# Patient Record
Sex: Female | Born: 1935
Health system: Southern US, Community
[De-identification: ages and names within clinical notes are randomized; demographics above are authoritative.]

## PROBLEM LIST (undated history)

## (undated) DIAGNOSIS — M1990 Unspecified osteoarthritis, unspecified site: Secondary | ICD-10-CM

## (undated) DIAGNOSIS — I10 Essential (primary) hypertension: Secondary | ICD-10-CM

## (undated) DIAGNOSIS — F32A Depression, unspecified: Secondary | ICD-10-CM

## (undated) DIAGNOSIS — H9190 Unspecified hearing loss, unspecified ear: Secondary | ICD-10-CM

---

## 2000-06-16 DIAGNOSIS — Z955 Presence of coronary angioplasty implant and graft: Secondary | ICD-10-CM

## 2000-06-16 HISTORY — DX: Presence of coronary angioplasty implant and graft: Z95.5

## 2000-11-18 ENCOUNTER — Ambulatory Visit (HOSPITAL_COMMUNITY): Admission: RE | Admit: 2000-11-18 | Discharge: 2000-11-19 | Payer: Self-pay | Admitting: *Deleted

## 2004-04-05 ENCOUNTER — Ambulatory Visit: Payer: Self-pay | Admitting: Internal Medicine

## 2004-05-03 ENCOUNTER — Ambulatory Visit: Payer: Self-pay | Admitting: Internal Medicine

## 2004-12-11 ENCOUNTER — Ambulatory Visit: Payer: Self-pay | Admitting: Internal Medicine

## 2005-07-22 ENCOUNTER — Ambulatory Visit: Payer: Self-pay | Admitting: Internal Medicine

## 2006-03-31 ENCOUNTER — Emergency Department: Payer: Self-pay | Admitting: Internal Medicine

## 2006-04-01 ENCOUNTER — Ambulatory Visit: Payer: Self-pay | Admitting: Emergency Medicine

## 2006-06-19 ENCOUNTER — Ambulatory Visit: Payer: Self-pay | Admitting: Internal Medicine

## 2006-08-26 ENCOUNTER — Ambulatory Visit: Payer: Self-pay | Admitting: Internal Medicine

## 2007-09-02 ENCOUNTER — Ambulatory Visit: Payer: Self-pay | Admitting: Internal Medicine

## 2008-07-06 ENCOUNTER — Ambulatory Visit: Payer: Self-pay | Admitting: Internal Medicine

## 2008-07-27 ENCOUNTER — Ambulatory Visit: Payer: Self-pay | Admitting: Podiatry

## 2008-08-04 ENCOUNTER — Ambulatory Visit: Payer: Self-pay | Admitting: Podiatry

## 2008-10-31 ENCOUNTER — Ambulatory Visit: Payer: Self-pay | Admitting: Internal Medicine

## 2009-01-01 ENCOUNTER — Ambulatory Visit: Payer: Self-pay | Admitting: Podiatry

## 2009-08-04 ENCOUNTER — Emergency Department: Payer: Self-pay | Admitting: Unknown Physician Specialty

## 2009-09-25 ENCOUNTER — Emergency Department: Payer: Self-pay | Admitting: Emergency Medicine

## 2009-11-13 ENCOUNTER — Ambulatory Visit: Payer: Self-pay | Admitting: Internal Medicine

## 2010-05-21 ENCOUNTER — Ambulatory Visit: Payer: Self-pay | Admitting: Internal Medicine

## 2010-11-18 ENCOUNTER — Ambulatory Visit: Payer: Self-pay | Admitting: Internal Medicine

## 2011-01-22 ENCOUNTER — Ambulatory Visit: Payer: Self-pay | Admitting: Family

## 2011-05-27 IMAGING — CT CT HEAD WITHOUT CONTRAST
2 series · 15 of 30 positions shown, 19 images · non-contrast
Comparison: none

REASON FOR EXAM: head injury
COMMENTS:

[Series 2: without · axial · non-contrast · 0.41mm/px · z∈[-180,-56]mm · 13 of 31 slices shown, 17 images]
[im 3/31  brain]
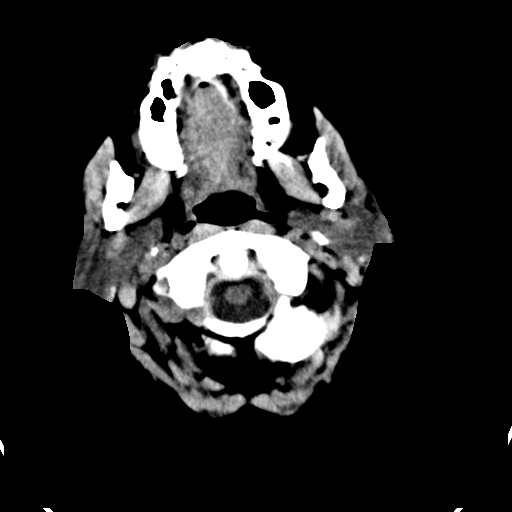
[im 3/31  bone]
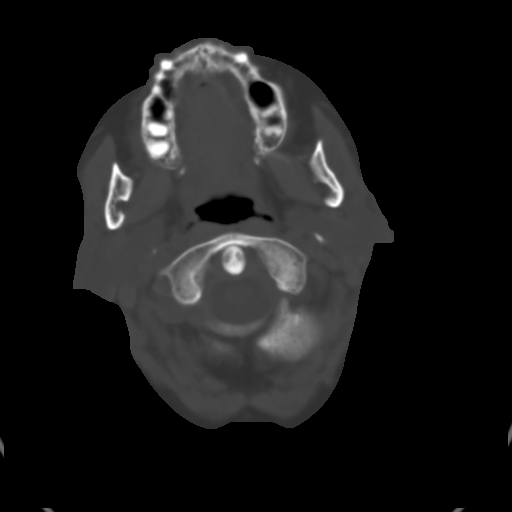
[im 5/31  brain]
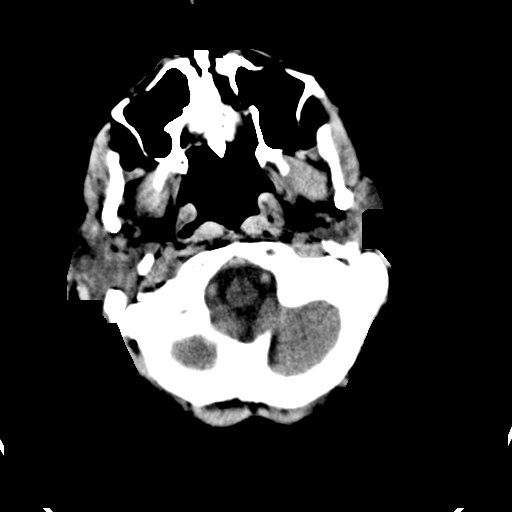
[im 7/31  brain]
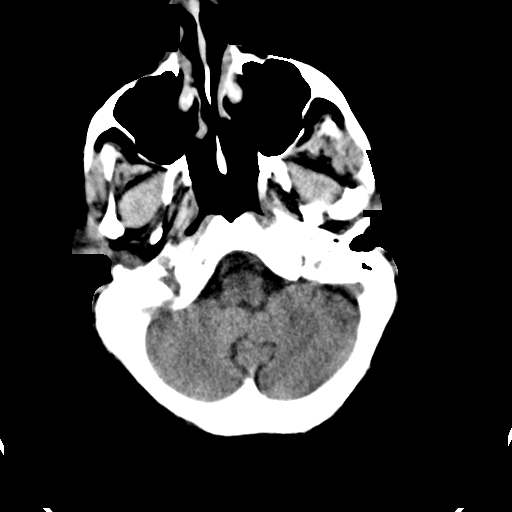
[im 9/31  brain]
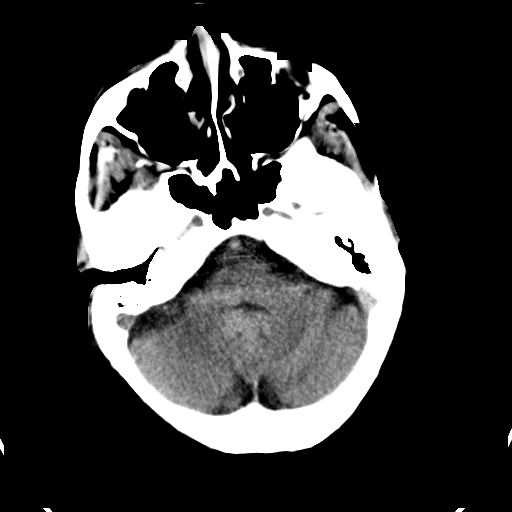
[im 11/31  brain]
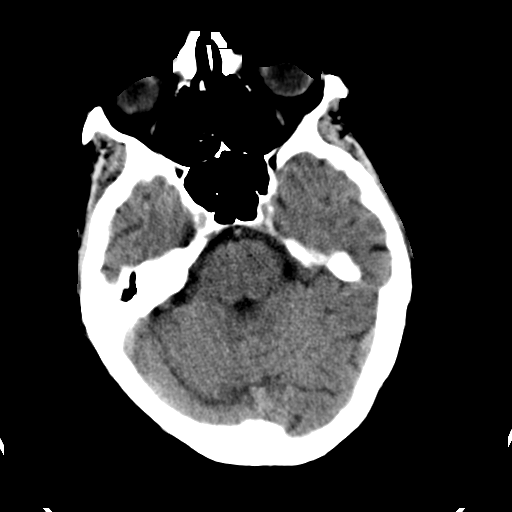
[im 11/31  bone]
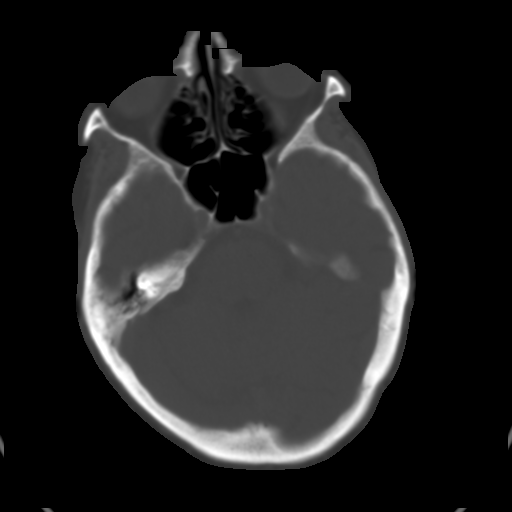
[im 13/31  brain]
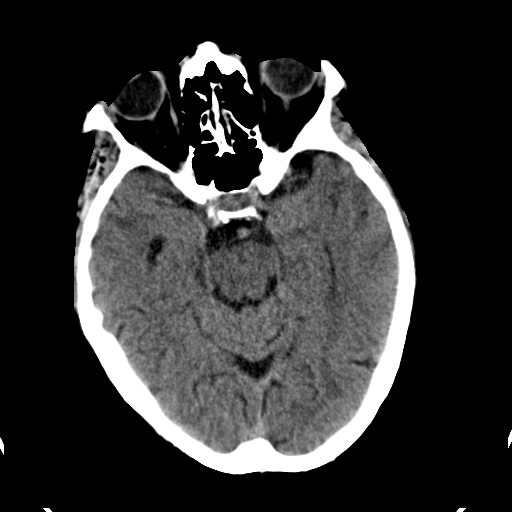
[im 16/31  brain]
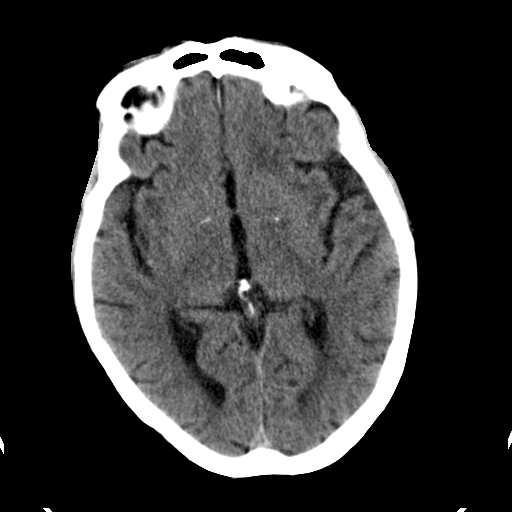
[im 18/31  brain]
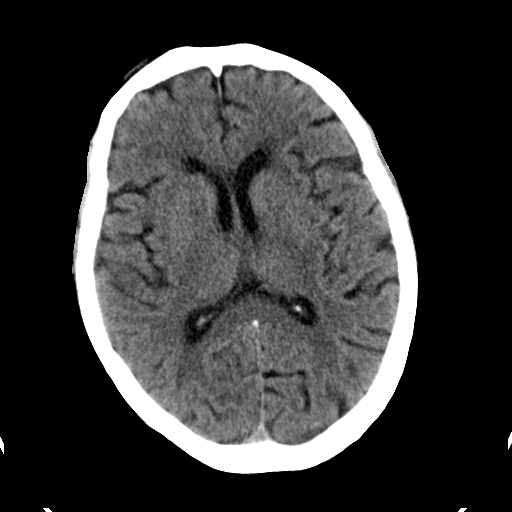
[im 20/31  brain]
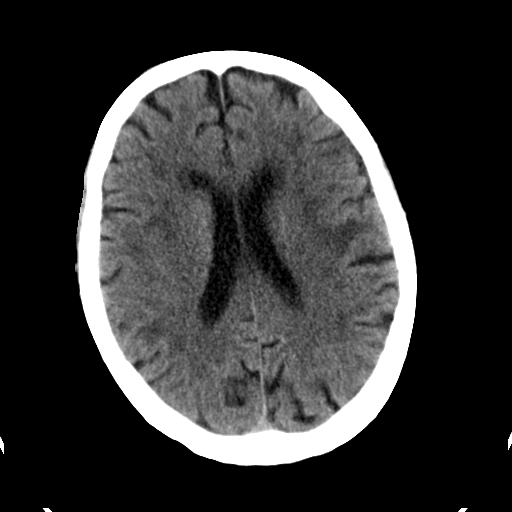
[im 20/31  bone]
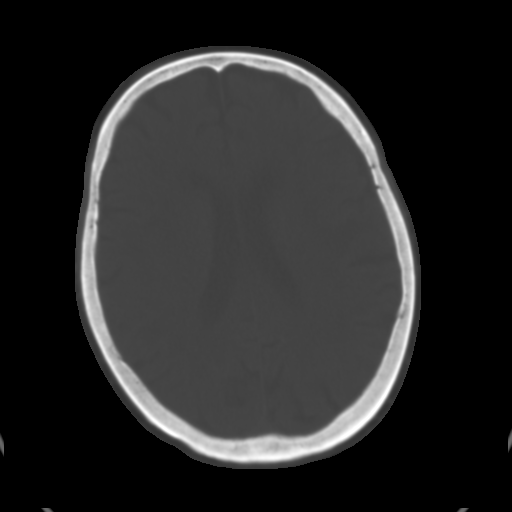
[im 22/31  brain]
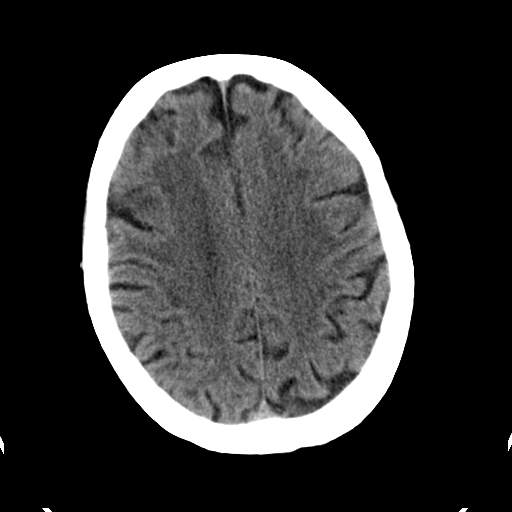
[im 24/31  brain]
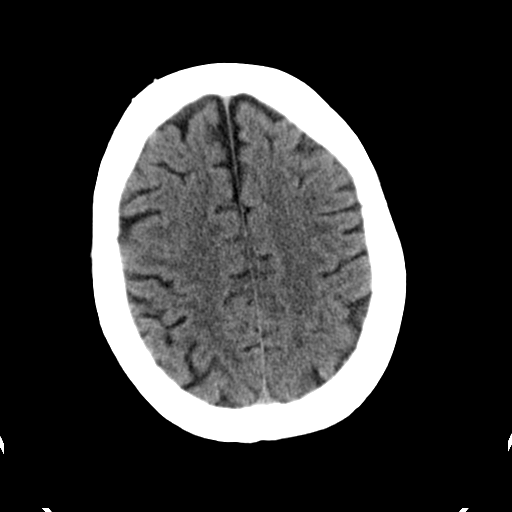
[im 26/31  brain]
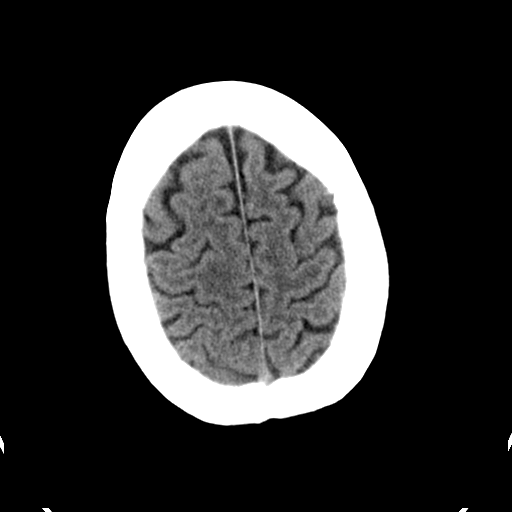
[im 28/31  brain]
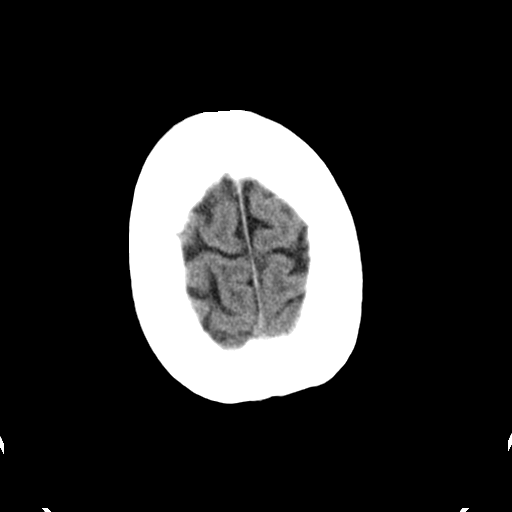
[im 28/31  bone]
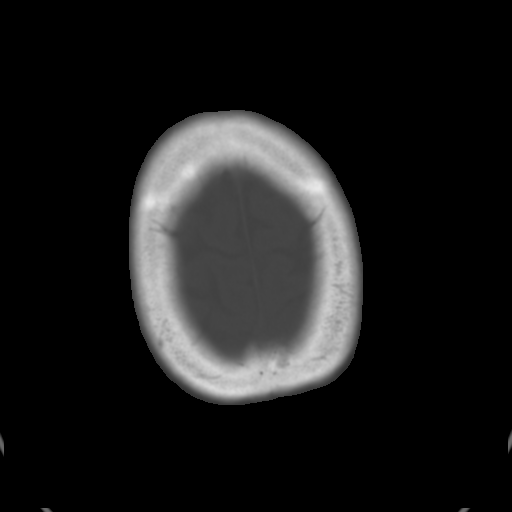

[Series 3: bone · axial · 0.41mm/px · z∈[-180,-160]mm · 2 of 31 slices shown]
[im 3/31  bone]
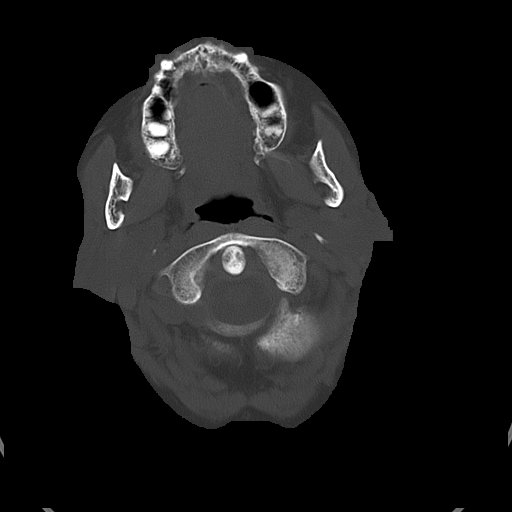
[im 7/31  bone]
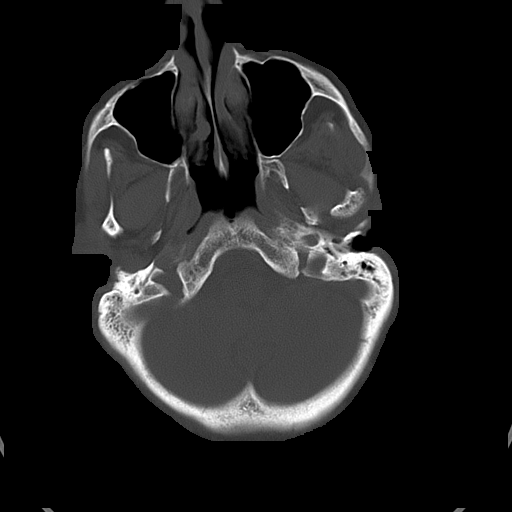

[15 of 30 positions shown; findings below may reference images not displayed]

PROCEDURE:     CT  - CT HEAD WITHOUT CONTRAST  - August 04, 2009 [DATE]

RESULT:     Axial noncontrast CT scanning was performed through the brain at
5 mm intervals and slice thicknesses. There are no prior studies for
comparison.

There are mild age-appropriate atrophic changes present. The ventricles are
normal in size and position. There is decreased density in the deep white
matter of both cerebral hemispheres consistent with chronic small vessel
ischemic type change. There is basal ganglia calcification bilaterally. I do
not see evidence of an acute intracranial hemorrhage nor of an acute
evolving ischemic infarction. At bone window settings the observed portions
of the paranasal sinuses are clear. There is disruption of the soft tissues
over the right forehead. I do not see evidence of an underlying skull
fracture.
IMPRESSION: I see no acute intracranial abnormality. There are chronic
age-appropriate changes present. There is disruption of the soft tissues
overlying the right forehead without evidence of an underlying fracture.

## 2011-11-25 ENCOUNTER — Ambulatory Visit: Payer: Self-pay | Admitting: Internal Medicine

## 2012-11-30 ENCOUNTER — Ambulatory Visit: Payer: Self-pay | Admitting: Internal Medicine

## 2014-01-06 ENCOUNTER — Ambulatory Visit: Payer: Self-pay | Admitting: Internal Medicine

## 2014-04-13 ENCOUNTER — Encounter: Payer: Self-pay | Admitting: Surgery

## 2014-04-16 ENCOUNTER — Encounter: Payer: Self-pay | Admitting: Surgery

## 2014-04-24 ENCOUNTER — Encounter: Payer: Self-pay | Admitting: Surgery

## 2014-07-20 DIAGNOSIS — L03116 Cellulitis of left lower limb: Secondary | ICD-10-CM | POA: Diagnosis not present

## 2014-07-28 DIAGNOSIS — L03116 Cellulitis of left lower limb: Secondary | ICD-10-CM | POA: Diagnosis not present

## 2014-07-28 DIAGNOSIS — E784 Other hyperlipidemia: Secondary | ICD-10-CM | POA: Diagnosis not present

## 2014-07-28 DIAGNOSIS — K5732 Diverticulitis of large intestine without perforation or abscess without bleeding: Secondary | ICD-10-CM | POA: Diagnosis not present

## 2014-07-28 DIAGNOSIS — I10 Essential (primary) hypertension: Secondary | ICD-10-CM | POA: Diagnosis not present

## 2014-08-18 DIAGNOSIS — H6120 Impacted cerumen, unspecified ear: Secondary | ICD-10-CM | POA: Diagnosis not present

## 2014-08-18 DIAGNOSIS — H918X9 Other specified hearing loss, unspecified ear: Secondary | ICD-10-CM | POA: Diagnosis not present

## 2014-08-18 DIAGNOSIS — I10 Essential (primary) hypertension: Secondary | ICD-10-CM | POA: Diagnosis not present

## 2014-08-18 DIAGNOSIS — S81812A Laceration without foreign body, left lower leg, initial encounter: Secondary | ICD-10-CM | POA: Diagnosis not present

## 2014-08-24 DIAGNOSIS — H938X9 Other specified disorders of ear, unspecified ear: Secondary | ICD-10-CM | POA: Diagnosis not present

## 2014-08-24 DIAGNOSIS — H6120 Impacted cerumen, unspecified ear: Secondary | ICD-10-CM | POA: Diagnosis not present

## 2014-10-13 DIAGNOSIS — H3532 Exudative age-related macular degeneration: Secondary | ICD-10-CM | POA: Diagnosis not present

## 2014-10-16 DIAGNOSIS — H3532 Exudative age-related macular degeneration: Secondary | ICD-10-CM | POA: Diagnosis not present

## 2014-11-24 DIAGNOSIS — I10 Essential (primary) hypertension: Secondary | ICD-10-CM | POA: Diagnosis not present

## 2014-11-30 DIAGNOSIS — H3532 Exudative age-related macular degeneration: Secondary | ICD-10-CM | POA: Diagnosis not present

## 2015-01-04 DIAGNOSIS — H3532 Exudative age-related macular degeneration: Secondary | ICD-10-CM | POA: Diagnosis not present

## 2015-02-12 DIAGNOSIS — H3531 Nonexudative age-related macular degeneration: Secondary | ICD-10-CM | POA: Diagnosis not present

## 2015-02-23 DIAGNOSIS — I10 Essential (primary) hypertension: Secondary | ICD-10-CM | POA: Diagnosis not present

## 2015-02-23 DIAGNOSIS — K5732 Diverticulitis of large intestine without perforation or abscess without bleeding: Secondary | ICD-10-CM | POA: Diagnosis not present

## 2015-02-23 DIAGNOSIS — E784 Other hyperlipidemia: Secondary | ICD-10-CM | POA: Diagnosis not present

## 2015-02-28 DIAGNOSIS — Z23 Encounter for immunization: Secondary | ICD-10-CM | POA: Diagnosis not present

## 2015-03-07 DIAGNOSIS — H3532 Exudative age-related macular degeneration: Secondary | ICD-10-CM | POA: Diagnosis not present

## 2015-04-18 DIAGNOSIS — H353211 Exudative age-related macular degeneration, right eye, with active choroidal neovascularization: Secondary | ICD-10-CM | POA: Diagnosis not present

## 2015-05-21 DIAGNOSIS — I10 Essential (primary) hypertension: Secondary | ICD-10-CM | POA: Diagnosis not present

## 2015-05-22 DIAGNOSIS — R5381 Other malaise: Secondary | ICD-10-CM | POA: Diagnosis not present

## 2015-05-22 DIAGNOSIS — I10 Essential (primary) hypertension: Secondary | ICD-10-CM | POA: Diagnosis not present

## 2015-05-22 DIAGNOSIS — E784 Other hyperlipidemia: Secondary | ICD-10-CM | POA: Diagnosis not present

## 2015-05-25 DIAGNOSIS — I10 Essential (primary) hypertension: Secondary | ICD-10-CM | POA: Diagnosis not present

## 2015-05-25 DIAGNOSIS — Z23 Encounter for immunization: Secondary | ICD-10-CM | POA: Diagnosis not present

## 2015-06-01 DIAGNOSIS — H353211 Exudative age-related macular degeneration, right eye, with active choroidal neovascularization: Secondary | ICD-10-CM | POA: Diagnosis not present

## 2015-07-11 DIAGNOSIS — H353211 Exudative age-related macular degeneration, right eye, with active choroidal neovascularization: Secondary | ICD-10-CM | POA: Diagnosis not present

## 2015-08-23 DIAGNOSIS — S81812A Laceration without foreign body, left lower leg, initial encounter: Secondary | ICD-10-CM | POA: Diagnosis not present

## 2015-08-23 DIAGNOSIS — K5732 Diverticulitis of large intestine without perforation or abscess without bleeding: Secondary | ICD-10-CM | POA: Diagnosis not present

## 2015-08-23 DIAGNOSIS — I10 Essential (primary) hypertension: Secondary | ICD-10-CM | POA: Diagnosis not present

## 2015-08-29 DIAGNOSIS — H353211 Exudative age-related macular degeneration, right eye, with active choroidal neovascularization: Secondary | ICD-10-CM | POA: Diagnosis not present

## 2015-10-24 DIAGNOSIS — H353211 Exudative age-related macular degeneration, right eye, with active choroidal neovascularization: Secondary | ICD-10-CM | POA: Diagnosis not present

## 2015-11-23 DIAGNOSIS — E784 Other hyperlipidemia: Secondary | ICD-10-CM | POA: Diagnosis not present

## 2015-11-23 DIAGNOSIS — I10 Essential (primary) hypertension: Secondary | ICD-10-CM | POA: Diagnosis not present

## 2015-12-04 DIAGNOSIS — D485 Neoplasm of uncertain behavior of skin: Secondary | ICD-10-CM | POA: Diagnosis not present

## 2015-12-04 DIAGNOSIS — C44311 Basal cell carcinoma of skin of nose: Secondary | ICD-10-CM | POA: Diagnosis not present

## 2015-12-21 DIAGNOSIS — H353211 Exudative age-related macular degeneration, right eye, with active choroidal neovascularization: Secondary | ICD-10-CM | POA: Diagnosis not present

## 2016-02-01 DIAGNOSIS — H353211 Exudative age-related macular degeneration, right eye, with active choroidal neovascularization: Secondary | ICD-10-CM | POA: Diagnosis not present

## 2016-02-22 DIAGNOSIS — C4491 Basal cell carcinoma of skin, unspecified: Secondary | ICD-10-CM | POA: Diagnosis not present

## 2016-02-22 DIAGNOSIS — Z23 Encounter for immunization: Secondary | ICD-10-CM | POA: Diagnosis not present

## 2016-02-22 DIAGNOSIS — K5732 Diverticulitis of large intestine without perforation or abscess without bleeding: Secondary | ICD-10-CM | POA: Diagnosis not present

## 2016-02-22 DIAGNOSIS — E784 Other hyperlipidemia: Secondary | ICD-10-CM | POA: Diagnosis not present

## 2016-03-04 DIAGNOSIS — C44311 Basal cell carcinoma of skin of nose: Secondary | ICD-10-CM | POA: Diagnosis not present

## 2016-03-14 DIAGNOSIS — H353211 Exudative age-related macular degeneration, right eye, with active choroidal neovascularization: Secondary | ICD-10-CM | POA: Diagnosis not present

## 2016-04-25 DIAGNOSIS — H353211 Exudative age-related macular degeneration, right eye, with active choroidal neovascularization: Secondary | ICD-10-CM | POA: Diagnosis not present

## 2016-05-29 DIAGNOSIS — E784 Other hyperlipidemia: Secondary | ICD-10-CM | POA: Diagnosis not present

## 2016-05-29 DIAGNOSIS — I10 Essential (primary) hypertension: Secondary | ICD-10-CM | POA: Diagnosis not present

## 2016-05-29 DIAGNOSIS — K5732 Diverticulitis of large intestine without perforation or abscess without bleeding: Secondary | ICD-10-CM | POA: Diagnosis not present

## 2016-06-06 DIAGNOSIS — H353211 Exudative age-related macular degeneration, right eye, with active choroidal neovascularization: Secondary | ICD-10-CM | POA: Diagnosis not present

## 2016-07-07 DIAGNOSIS — C4491 Basal cell carcinoma of skin, unspecified: Secondary | ICD-10-CM | POA: Diagnosis not present

## 2016-07-07 DIAGNOSIS — E784 Other hyperlipidemia: Secondary | ICD-10-CM | POA: Diagnosis not present

## 2016-07-07 DIAGNOSIS — K5732 Diverticulitis of large intestine without perforation or abscess without bleeding: Secondary | ICD-10-CM | POA: Diagnosis not present

## 2016-07-11 DIAGNOSIS — H353211 Exudative age-related macular degeneration, right eye, with active choroidal neovascularization: Secondary | ICD-10-CM | POA: Diagnosis not present

## 2016-08-22 DIAGNOSIS — H353211 Exudative age-related macular degeneration, right eye, with active choroidal neovascularization: Secondary | ICD-10-CM | POA: Diagnosis not present

## 2016-08-28 DIAGNOSIS — E784 Other hyperlipidemia: Secondary | ICD-10-CM | POA: Diagnosis not present

## 2016-08-28 DIAGNOSIS — K5732 Diverticulitis of large intestine without perforation or abscess without bleeding: Secondary | ICD-10-CM | POA: Diagnosis not present

## 2016-08-28 DIAGNOSIS — I10 Essential (primary) hypertension: Secondary | ICD-10-CM | POA: Diagnosis not present

## 2016-09-11 ENCOUNTER — Ambulatory Visit
Admission: RE | Admit: 2016-09-11 | Discharge: 2016-09-11 | Disposition: A | Discharge status: Home / Self Care | Payer: Medicare HMO | Source: Ambulatory Visit | Attending: Internal Medicine | Admitting: Internal Medicine

## 2016-09-11 ENCOUNTER — Other Ambulatory Visit: Payer: Self-pay | Admitting: Internal Medicine

## 2016-09-11 DIAGNOSIS — M1711 Unilateral primary osteoarthritis, right knee: Secondary | ICD-10-CM | POA: Diagnosis not present

## 2016-09-11 DIAGNOSIS — I10 Essential (primary) hypertension: Secondary | ICD-10-CM | POA: Diagnosis not present

## 2016-09-11 DIAGNOSIS — M02369 Reiter's disease, unspecified knee: Secondary | ICD-10-CM | POA: Diagnosis not present

## 2016-09-11 DIAGNOSIS — M25561 Pain in right knee: Secondary | ICD-10-CM | POA: Diagnosis not present

## 2016-09-11 DIAGNOSIS — Z880 Allergy status to penicillin: Secondary | ICD-10-CM | POA: Diagnosis not present

## 2016-09-11 DIAGNOSIS — E784 Other hyperlipidemia: Secondary | ICD-10-CM | POA: Diagnosis not present

## 2016-09-16 DIAGNOSIS — Z880 Allergy status to penicillin: Secondary | ICD-10-CM | POA: Diagnosis not present

## 2016-09-16 DIAGNOSIS — E784 Other hyperlipidemia: Secondary | ICD-10-CM | POA: Diagnosis not present

## 2016-09-16 DIAGNOSIS — M02369 Reiter's disease, unspecified knee: Secondary | ICD-10-CM | POA: Diagnosis not present

## 2016-09-16 DIAGNOSIS — I10 Essential (primary) hypertension: Secondary | ICD-10-CM | POA: Diagnosis not present

## 2016-10-03 DIAGNOSIS — H353211 Exudative age-related macular degeneration, right eye, with active choroidal neovascularization: Secondary | ICD-10-CM | POA: Diagnosis not present

## 2016-11-21 DIAGNOSIS — H353211 Exudative age-related macular degeneration, right eye, with active choroidal neovascularization: Secondary | ICD-10-CM | POA: Diagnosis not present

## 2016-11-27 DIAGNOSIS — K5732 Diverticulitis of large intestine without perforation or abscess without bleeding: Secondary | ICD-10-CM | POA: Diagnosis not present

## 2016-11-27 DIAGNOSIS — E784 Other hyperlipidemia: Secondary | ICD-10-CM | POA: Diagnosis not present

## 2016-11-27 DIAGNOSIS — I10 Essential (primary) hypertension: Secondary | ICD-10-CM | POA: Diagnosis not present

## 2016-11-27 DIAGNOSIS — M2392 Unspecified internal derangement of left knee: Secondary | ICD-10-CM | POA: Diagnosis not present

## 2017-01-06 DIAGNOSIS — H353211 Exudative age-related macular degeneration, right eye, with active choroidal neovascularization: Secondary | ICD-10-CM | POA: Diagnosis not present

## 2017-02-25 DIAGNOSIS — H353211 Exudative age-related macular degeneration, right eye, with active choroidal neovascularization: Secondary | ICD-10-CM | POA: Diagnosis not present

## 2017-02-27 DIAGNOSIS — K5732 Diverticulitis of large intestine without perforation or abscess without bleeding: Secondary | ICD-10-CM | POA: Diagnosis not present

## 2017-02-27 DIAGNOSIS — I10 Essential (primary) hypertension: Secondary | ICD-10-CM | POA: Diagnosis not present

## 2017-02-27 DIAGNOSIS — M02369 Reiter's disease, unspecified knee: Secondary | ICD-10-CM | POA: Diagnosis not present

## 2017-02-27 DIAGNOSIS — E784 Other hyperlipidemia: Secondary | ICD-10-CM | POA: Diagnosis not present

## 2017-04-08 DIAGNOSIS — H2513 Age-related nuclear cataract, bilateral: Secondary | ICD-10-CM | POA: Diagnosis not present

## 2017-04-08 DIAGNOSIS — H353211 Exudative age-related macular degeneration, right eye, with active choroidal neovascularization: Secondary | ICD-10-CM | POA: Diagnosis not present

## 2017-05-20 DIAGNOSIS — H353211 Exudative age-related macular degeneration, right eye, with active choroidal neovascularization: Secondary | ICD-10-CM | POA: Diagnosis not present

## 2017-05-29 DIAGNOSIS — Z0189 Encounter for other specified special examinations: Secondary | ICD-10-CM | POA: Diagnosis not present

## 2017-07-01 DIAGNOSIS — H353211 Exudative age-related macular degeneration, right eye, with active choroidal neovascularization: Secondary | ICD-10-CM | POA: Diagnosis not present

## 2017-08-12 DIAGNOSIS — H353211 Exudative age-related macular degeneration, right eye, with active choroidal neovascularization: Secondary | ICD-10-CM | POA: Diagnosis not present

## 2017-08-12 DIAGNOSIS — H2513 Age-related nuclear cataract, bilateral: Secondary | ICD-10-CM | POA: Diagnosis not present

## 2017-09-01 DIAGNOSIS — K5732 Diverticulitis of large intestine without perforation or abscess without bleeding: Secondary | ICD-10-CM | POA: Diagnosis not present

## 2017-09-01 DIAGNOSIS — I10 Essential (primary) hypertension: Secondary | ICD-10-CM | POA: Diagnosis not present

## 2017-09-01 DIAGNOSIS — M02369 Reiter's disease, unspecified knee: Secondary | ICD-10-CM | POA: Diagnosis not present

## 2017-09-01 DIAGNOSIS — E785 Hyperlipidemia, unspecified: Secondary | ICD-10-CM | POA: Diagnosis not present

## 2017-09-25 DIAGNOSIS — Z85828 Personal history of other malignant neoplasm of skin: Secondary | ICD-10-CM | POA: Diagnosis not present

## 2017-09-25 DIAGNOSIS — Z85820 Personal history of malignant melanoma of skin: Secondary | ICD-10-CM | POA: Diagnosis not present

## 2017-09-25 DIAGNOSIS — L570 Actinic keratosis: Secondary | ICD-10-CM | POA: Diagnosis not present

## 2017-09-25 DIAGNOSIS — L821 Other seborrheic keratosis: Secondary | ICD-10-CM | POA: Diagnosis not present

## 2017-09-29 DIAGNOSIS — I10 Essential (primary) hypertension: Secondary | ICD-10-CM | POA: Diagnosis not present

## 2017-09-29 DIAGNOSIS — K5732 Diverticulitis of large intestine without perforation or abscess without bleeding: Secondary | ICD-10-CM | POA: Diagnosis not present

## 2017-09-29 DIAGNOSIS — M02369 Reiter's disease, unspecified knee: Secondary | ICD-10-CM | POA: Diagnosis not present

## 2017-09-29 DIAGNOSIS — E785 Hyperlipidemia, unspecified: Secondary | ICD-10-CM | POA: Diagnosis not present

## 2017-09-30 DIAGNOSIS — H353211 Exudative age-related macular degeneration, right eye, with active choroidal neovascularization: Secondary | ICD-10-CM | POA: Diagnosis not present

## 2017-11-18 DIAGNOSIS — H353211 Exudative age-related macular degeneration, right eye, with active choroidal neovascularization: Secondary | ICD-10-CM | POA: Diagnosis not present

## 2017-12-01 DIAGNOSIS — K5732 Diverticulitis of large intestine without perforation or abscess without bleeding: Secondary | ICD-10-CM | POA: Diagnosis not present

## 2017-12-01 DIAGNOSIS — I10 Essential (primary) hypertension: Secondary | ICD-10-CM | POA: Diagnosis not present

## 2017-12-01 DIAGNOSIS — M02369 Reiter's disease, unspecified knee: Secondary | ICD-10-CM | POA: Diagnosis not present

## 2017-12-01 DIAGNOSIS — E785 Hyperlipidemia, unspecified: Secondary | ICD-10-CM | POA: Diagnosis not present

## 2017-12-23 DIAGNOSIS — H2513 Age-related nuclear cataract, bilateral: Secondary | ICD-10-CM | POA: Diagnosis not present

## 2017-12-23 DIAGNOSIS — H353211 Exudative age-related macular degeneration, right eye, with active choroidal neovascularization: Secondary | ICD-10-CM | POA: Diagnosis not present

## 2018-02-11 DIAGNOSIS — H353211 Exudative age-related macular degeneration, right eye, with active choroidal neovascularization: Secondary | ICD-10-CM | POA: Diagnosis not present

## 2018-03-02 DIAGNOSIS — M02369 Reiter's disease, unspecified knee: Secondary | ICD-10-CM | POA: Diagnosis not present

## 2018-03-02 DIAGNOSIS — E785 Hyperlipidemia, unspecified: Secondary | ICD-10-CM | POA: Diagnosis not present

## 2018-03-02 DIAGNOSIS — I10 Essential (primary) hypertension: Secondary | ICD-10-CM | POA: Diagnosis not present

## 2018-03-02 DIAGNOSIS — K5732 Diverticulitis of large intestine without perforation or abscess without bleeding: Secondary | ICD-10-CM | POA: Diagnosis not present

## 2018-03-02 DIAGNOSIS — Z23 Encounter for immunization: Secondary | ICD-10-CM | POA: Diagnosis not present

## 2018-03-25 DIAGNOSIS — H353122 Nonexudative age-related macular degeneration, left eye, intermediate dry stage: Secondary | ICD-10-CM | POA: Diagnosis not present

## 2018-03-25 DIAGNOSIS — H353211 Exudative age-related macular degeneration, right eye, with active choroidal neovascularization: Secondary | ICD-10-CM | POA: Diagnosis not present

## 2018-05-06 DIAGNOSIS — H353211 Exudative age-related macular degeneration, right eye, with active choroidal neovascularization: Secondary | ICD-10-CM | POA: Diagnosis not present

## 2018-05-28 DIAGNOSIS — C44619 Basal cell carcinoma of skin of left upper limb, including shoulder: Secondary | ICD-10-CM | POA: Diagnosis not present

## 2018-05-28 DIAGNOSIS — C44712 Basal cell carcinoma of skin of right lower limb, including hip: Secondary | ICD-10-CM | POA: Diagnosis not present

## 2018-05-28 DIAGNOSIS — Z85828 Personal history of other malignant neoplasm of skin: Secondary | ICD-10-CM | POA: Diagnosis not present

## 2018-05-28 DIAGNOSIS — C44519 Basal cell carcinoma of skin of other part of trunk: Secondary | ICD-10-CM | POA: Diagnosis not present

## 2018-05-28 DIAGNOSIS — L570 Actinic keratosis: Secondary | ICD-10-CM | POA: Diagnosis not present

## 2018-05-28 DIAGNOSIS — L821 Other seborrheic keratosis: Secondary | ICD-10-CM | POA: Diagnosis not present

## 2018-05-28 DIAGNOSIS — Z85820 Personal history of malignant melanoma of skin: Secondary | ICD-10-CM | POA: Diagnosis not present

## 2018-06-01 DIAGNOSIS — Z0000 Encounter for general adult medical examination without abnormal findings: Secondary | ICD-10-CM | POA: Diagnosis not present

## 2018-06-01 DIAGNOSIS — E7849 Other hyperlipidemia: Secondary | ICD-10-CM | POA: Diagnosis not present

## 2018-06-01 DIAGNOSIS — R5381 Other malaise: Secondary | ICD-10-CM | POA: Diagnosis not present

## 2018-06-01 DIAGNOSIS — M02369 Reiter's disease, unspecified knee: Secondary | ICD-10-CM | POA: Diagnosis not present

## 2018-06-01 DIAGNOSIS — E7841 Elevated Lipoprotein(a): Secondary | ICD-10-CM | POA: Diagnosis not present

## 2018-06-01 DIAGNOSIS — K5732 Diverticulitis of large intestine without perforation or abscess without bleeding: Secondary | ICD-10-CM | POA: Diagnosis not present

## 2018-06-01 DIAGNOSIS — E785 Hyperlipidemia, unspecified: Secondary | ICD-10-CM | POA: Diagnosis not present

## 2018-06-01 DIAGNOSIS — I10 Essential (primary) hypertension: Secondary | ICD-10-CM | POA: Diagnosis not present

## 2018-06-22 DIAGNOSIS — H353211 Exudative age-related macular degeneration, right eye, with active choroidal neovascularization: Secondary | ICD-10-CM | POA: Diagnosis not present

## 2018-07-19 DIAGNOSIS — K5732 Diverticulitis of large intestine without perforation or abscess without bleeding: Secondary | ICD-10-CM | POA: Diagnosis not present

## 2018-07-19 DIAGNOSIS — E785 Hyperlipidemia, unspecified: Secondary | ICD-10-CM | POA: Diagnosis not present

## 2018-07-19 DIAGNOSIS — M02369 Reiter's disease, unspecified knee: Secondary | ICD-10-CM | POA: Diagnosis not present

## 2018-07-19 DIAGNOSIS — I10 Essential (primary) hypertension: Secondary | ICD-10-CM | POA: Diagnosis not present

## 2018-08-04 DIAGNOSIS — H353211 Exudative age-related macular degeneration, right eye, with active choroidal neovascularization: Secondary | ICD-10-CM | POA: Diagnosis not present

## 2018-08-31 DIAGNOSIS — I10 Essential (primary) hypertension: Secondary | ICD-10-CM | POA: Diagnosis not present

## 2018-08-31 DIAGNOSIS — M02369 Reiter's disease, unspecified knee: Secondary | ICD-10-CM | POA: Diagnosis not present

## 2018-08-31 DIAGNOSIS — E785 Hyperlipidemia, unspecified: Secondary | ICD-10-CM | POA: Diagnosis not present

## 2018-08-31 DIAGNOSIS — K5732 Diverticulitis of large intestine without perforation or abscess without bleeding: Secondary | ICD-10-CM | POA: Diagnosis not present

## 2018-09-09 DIAGNOSIS — H353211 Exudative age-related macular degeneration, right eye, with active choroidal neovascularization: Secondary | ICD-10-CM | POA: Diagnosis not present

## 2018-10-28 DIAGNOSIS — H353211 Exudative age-related macular degeneration, right eye, with active choroidal neovascularization: Secondary | ICD-10-CM | POA: Diagnosis not present

## 2018-12-07 DIAGNOSIS — E785 Hyperlipidemia, unspecified: Secondary | ICD-10-CM | POA: Diagnosis not present

## 2018-12-07 DIAGNOSIS — I10 Essential (primary) hypertension: Secondary | ICD-10-CM | POA: Diagnosis not present

## 2018-12-07 DIAGNOSIS — K5732 Diverticulitis of large intestine without perforation or abscess without bleeding: Secondary | ICD-10-CM | POA: Diagnosis not present

## 2018-12-07 DIAGNOSIS — M02369 Reiter's disease, unspecified knee: Secondary | ICD-10-CM | POA: Diagnosis not present

## 2018-12-09 DIAGNOSIS — H353211 Exudative age-related macular degeneration, right eye, with active choroidal neovascularization: Secondary | ICD-10-CM | POA: Diagnosis not present

## 2019-02-03 DIAGNOSIS — H2513 Age-related nuclear cataract, bilateral: Secondary | ICD-10-CM | POA: Diagnosis not present

## 2019-02-03 DIAGNOSIS — H353211 Exudative age-related macular degeneration, right eye, with active choroidal neovascularization: Secondary | ICD-10-CM | POA: Diagnosis not present

## 2019-02-18 DIAGNOSIS — L821 Other seborrheic keratosis: Secondary | ICD-10-CM | POA: Diagnosis not present

## 2019-02-18 DIAGNOSIS — I781 Nevus, non-neoplastic: Secondary | ICD-10-CM | POA: Diagnosis not present

## 2019-02-18 DIAGNOSIS — Z85828 Personal history of other malignant neoplasm of skin: Secondary | ICD-10-CM | POA: Diagnosis not present

## 2019-03-09 DIAGNOSIS — K5732 Diverticulitis of large intestine without perforation or abscess without bleeding: Secondary | ICD-10-CM | POA: Diagnosis not present

## 2019-03-09 DIAGNOSIS — Z23 Encounter for immunization: Secondary | ICD-10-CM | POA: Diagnosis not present

## 2019-03-09 DIAGNOSIS — I10 Essential (primary) hypertension: Secondary | ICD-10-CM | POA: Diagnosis not present

## 2019-03-09 DIAGNOSIS — M02369 Reiter's disease, unspecified knee: Secondary | ICD-10-CM | POA: Diagnosis not present

## 2019-03-09 DIAGNOSIS — E785 Hyperlipidemia, unspecified: Secondary | ICD-10-CM | POA: Diagnosis not present

## 2019-03-09 DIAGNOSIS — Z0000 Encounter for general adult medical examination without abnormal findings: Secondary | ICD-10-CM | POA: Diagnosis not present

## 2019-03-24 DIAGNOSIS — H353211 Exudative age-related macular degeneration, right eye, with active choroidal neovascularization: Secondary | ICD-10-CM | POA: Diagnosis not present

## 2019-05-19 DIAGNOSIS — H353211 Exudative age-related macular degeneration, right eye, with active choroidal neovascularization: Secondary | ICD-10-CM | POA: Diagnosis not present

## 2019-06-02 DIAGNOSIS — R5381 Other malaise: Secondary | ICD-10-CM | POA: Diagnosis not present

## 2019-06-02 DIAGNOSIS — I10 Essential (primary) hypertension: Secondary | ICD-10-CM | POA: Diagnosis not present

## 2019-06-02 DIAGNOSIS — E7849 Other hyperlipidemia: Secondary | ICD-10-CM | POA: Diagnosis not present

## 2019-06-02 DIAGNOSIS — E7841 Elevated Lipoprotein(a): Secondary | ICD-10-CM | POA: Diagnosis not present

## 2019-06-23 DIAGNOSIS — E785 Hyperlipidemia, unspecified: Secondary | ICD-10-CM | POA: Diagnosis not present

## 2019-06-23 DIAGNOSIS — M02369 Reiter's disease, unspecified knee: Secondary | ICD-10-CM | POA: Diagnosis not present

## 2019-06-23 DIAGNOSIS — I10 Essential (primary) hypertension: Secondary | ICD-10-CM | POA: Diagnosis not present

## 2019-06-23 DIAGNOSIS — K5732 Diverticulitis of large intestine without perforation or abscess without bleeding: Secondary | ICD-10-CM | POA: Diagnosis not present

## 2019-07-14 DIAGNOSIS — H353122 Nonexudative age-related macular degeneration, left eye, intermediate dry stage: Secondary | ICD-10-CM | POA: Diagnosis not present

## 2019-07-14 DIAGNOSIS — H353211 Exudative age-related macular degeneration, right eye, with active choroidal neovascularization: Secondary | ICD-10-CM | POA: Diagnosis not present

## 2019-08-17 DIAGNOSIS — H353231 Exudative age-related macular degeneration, bilateral, with active choroidal neovascularization: Secondary | ICD-10-CM | POA: Diagnosis not present

## 2019-09-22 DIAGNOSIS — M02369 Reiter's disease, unspecified knee: Secondary | ICD-10-CM | POA: Diagnosis not present

## 2019-09-22 DIAGNOSIS — K5732 Diverticulitis of large intestine without perforation or abscess without bleeding: Secondary | ICD-10-CM | POA: Diagnosis not present

## 2019-09-22 DIAGNOSIS — I10 Essential (primary) hypertension: Secondary | ICD-10-CM | POA: Diagnosis not present

## 2019-09-22 DIAGNOSIS — E785 Hyperlipidemia, unspecified: Secondary | ICD-10-CM | POA: Diagnosis not present

## 2019-09-29 DIAGNOSIS — H353231 Exudative age-related macular degeneration, bilateral, with active choroidal neovascularization: Secondary | ICD-10-CM | POA: Diagnosis not present

## 2019-11-17 DIAGNOSIS — H353231 Exudative age-related macular degeneration, bilateral, with active choroidal neovascularization: Secondary | ICD-10-CM | POA: Diagnosis not present

## 2019-12-22 ENCOUNTER — Ambulatory Visit (INDEPENDENT_AMBULATORY_CARE_PROVIDER_SITE_OTHER): Payer: Medicare HMO | Admitting: Internal Medicine

## 2019-12-22 ENCOUNTER — Encounter: Payer: Self-pay | Admitting: Internal Medicine

## 2019-12-22 ENCOUNTER — Other Ambulatory Visit: Payer: Self-pay

## 2019-12-22 VITALS — BP 146/76 | HR 60 | Ht 62.0 in | Wt 117.9 lb

## 2019-12-22 DIAGNOSIS — M810 Age-related osteoporosis without current pathological fracture: Secondary | ICD-10-CM | POA: Diagnosis not present

## 2019-12-22 DIAGNOSIS — E785 Hyperlipidemia, unspecified: Secondary | ICD-10-CM

## 2019-12-22 DIAGNOSIS — H8093 Unspecified otosclerosis, bilateral: Secondary | ICD-10-CM

## 2019-12-22 DIAGNOSIS — I10 Essential (primary) hypertension: Secondary | ICD-10-CM

## 2019-12-22 NOTE — Progress Notes (Signed)
Established Patient Office Visit  SUBJECTIVE:  Subjective  Patient ID: Katrina Bailey, female    DOB: 01/02/1924  Age: 86 y.o. MRN: [id number]  CC:  Chief Complaint  Patient presents with  . Hypertension    recheck blood pressure     HPI Katrina Bailey is a 86 y.o. female presenting today for a blood pressure recheck.  Her blood pressure today was 146/76.   She has several warts on her face that she is concerned about.   History reviewed. No pertinent past medical history.  History reviewed. No pertinent surgical history.  History reviewed. No pertinent family history.  Social History   Socioeconomic History  . Marital status: Widowed    Spouse name: Not on file  . Number of children: Not on file  . Years of education: Not on file  . Highest education level: Not on file  Occupational History  . Not on file  Tobacco Use  . Smoking status: Never Smoker  . Smokeless tobacco: Never Used  Substance and Sexual Activity  . Alcohol use: Never  . Drug use: Never  . Sexual activity: Not Currently  Other Topics Concern  . Not on file  Social History Narrative  . Not on file   Social Determinants of Health   Financial Resource Strain:   . Difficulty of Paying Living Expenses:   Food Insecurity:   . Worried About Charity fundraiser in the Last Year:   . Arboriculturist in the Last Year:   Transportation Needs:   . Film/video editor (Medical):   Marland Kitchen Lack of Transportation (Non-Medical):   Physical Activity:   . Days of Exercise per Week:   . Minutes of Exercise per Session:   Stress:   . Feeling of Stress :   Social Connections:   . Frequency of Communication with Friends and Family:   . Frequency of Social Gatherings with Friends and Family:   . Attends Religious Services:   . Active Member of Clubs or Organizations:   . Attends Archivist Meetings:   Marland Kitchen Marital Status:   Intimate Partner Violence:   . Fear of Current or Ex-Partner:   .  Emotionally Abused:   Marland Kitchen Physically Abused:   . Sexually Abused:      Current Outpatient Medications:  .  amLODipine (NORVASC) 5 MG tablet, Take 5 mg by mouth daily., Disp: , Rfl:  .  metoprolol tartrate (LOPRESSOR) 100 MG tablet, Take 100 mg by mouth daily., Disp: , Rfl:  .  Multiple Vitamin (MULTI-VITAMIN) tablet, Take 1 tablet by mouth daily., Disp: , Rfl:  .  quinapril (ACCUPRIL) 40 MG tablet, Take 40 mg by mouth daily., Disp: , Rfl:  .  simvastatin (ZOCOR) 40 MG tablet, Take 40 mg by mouth daily., Disp: , Rfl:    No Known Allergies  ROS Review of Systems  Constitutional: Negative.   HENT: Positive for hearing loss.   Eyes: Negative.   Respiratory: Negative.   Cardiovascular: Negative.   Gastrointestinal: Negative.   Endocrine: Negative.   Genitourinary: Negative.   Musculoskeletal: Negative.   Skin: Positive for rash.  Allergic/Immunologic: Negative.   Neurological: Negative.   Hematological: Negative.   Psychiatric/Behavioral: Negative.   All other systems reviewed and are negative.    OBJECTIVE:    Physical Exam Vitals reviewed.  Constitutional:      Appearance: Normal appearance.  HENT:     Right Ear: Decreased hearing noted.  Left Ear: Decreased hearing noted.     Mouth/Throat:     Mouth: Mucous membranes are moist.  Eyes:     Pupils: Pupils are equal, round, and reactive to light.  Cardiovascular:     Rate and Rhythm: Normal rate and regular rhythm.     Pulses: Normal pulses.     Heart sounds: Normal heart sounds.  Pulmonary:     Effort: Pulmonary effort is normal.     Breath sounds: Normal breath sounds.  Abdominal:     Palpations: There is no hepatomegaly, splenomegaly or mass.     Tenderness: There is no abdominal tenderness.  Musculoskeletal:     Right lower leg: No edema.     Left lower leg: 1+ Edema present.  Feet:     Right foot:     Toenail Condition: Fungal disease present.    Left foot:     Toenail Condition: Fungal disease  present.    Comments: Onychomycosis present Skin:    Comments: Several warts on face () Kyphosis on back  Neurological:     Mental Status: She is alert and oriented to person, place, and time.  Psychiatric:        Mood and Affect: Mood and affect normal.        Behavior: Behavior normal.     BP (!) 146/76   Pulse 60   Ht '5\' 2"'  (1.575 m)   Wt 117 lb 14.4 oz (53.5 kg)   BMI 21.56 kg/m  Wt Readings from Last 3 Encounters:  12/22/19 117 lb 14.4 oz (53.5 kg)    Health Maintenance Due  Topic Date Due  . COVID-19 Vaccine (1) Never done  . TETANUS/TDAP  Never done  . DEXA SCAN  Never done  . PNA vac Low Risk Adult (1 of 2 - PCV13) Never done    There are no preventive care reminders to display for this patient.  No flowsheet data found. No flowsheet data found.  No results found for: TSH No results found for: ALBUMIN, ANIONGAP, EGFR, GFR No results found for: CHOL, HDL, LDLCALC, CHOLHDL No results found for: TRIG No results found for: HGBA1C    ASSESSMENT & PLAN:   Problem List Items Addressed This Visit      Cardiovascular and Mediastinum   Essential hypertension - Primary    - Today, the patient's blood pressure is well managed on norvasc. - The patient will continue the current treatment regimen.  - I encouraged the patient to eat a low-sodium diet to help control blood pressure. - I encouraged the patient to live an active lifestyle and complete activities that increases heart rate to 85% target heart rate at least 5 times per week for one hour.          Relevant Medications   amLODipine (NORVASC) 5 MG tablet   metoprolol tartrate (LOPRESSOR) 100 MG tablet   quinapril (ACCUPRIL) 40 MG tablet   simvastatin (ZOCOR) 40 MG tablet     Nervous and Auditory   Otosclerosis of both ears    Ref to ent        Musculoskeletal and Integument   Age-related osteoporosis without current pathological fracture    Stable on vit d        Other   Dyslipidemia     On statin      Relevant Medications   simvastatin (ZOCOR) 40 MG tablet      No orders of the defined types were placed in this encounter.  Follow-up: No follow-ups on file.  Ref to skin doctor   Dr. Alexsa Canary [facility] [address] [place], [place] [address]   By signing my name below, I, [employer], attest that this documentation has been prepared under the direction and in the presence of Cletis Athens, MD. Electronically Signed: Cletis Athens, MD 12/24/19, 2:17 PM    I personally performed the services described in this documentation, which was SCRIBED in my presence. The recorded information has been reviewed and considered accurate. It has been edited as necessary during review. Cletis Athens, MD

## 2019-12-24 ENCOUNTER — Encounter: Payer: Self-pay | Admitting: Internal Medicine

## 2019-12-24 DIAGNOSIS — M47815 Spondylosis without myelopathy or radiculopathy, thoracolumbar region: Secondary | ICD-10-CM | POA: Insufficient documentation

## 2019-12-24 DIAGNOSIS — I10 Essential (primary) hypertension: Secondary | ICD-10-CM | POA: Insufficient documentation

## 2019-12-24 DIAGNOSIS — H8093 Unspecified otosclerosis, bilateral: Secondary | ICD-10-CM | POA: Insufficient documentation

## 2019-12-24 DIAGNOSIS — E785 Hyperlipidemia, unspecified: Secondary | ICD-10-CM | POA: Insufficient documentation

## 2019-12-24 DIAGNOSIS — M810 Age-related osteoporosis without current pathological fracture: Secondary | ICD-10-CM | POA: Insufficient documentation

## 2019-12-24 NOTE — Assessment & Plan Note (Signed)
On statin.

## 2019-12-24 NOTE — Assessment & Plan Note (Signed)
Stable on vit d

## 2019-12-24 NOTE — Assessment & Plan Note (Signed)
Ref to ent

## 2019-12-24 NOTE — Assessment & Plan Note (Signed)
-   Today, the patient's blood pressure is well managed on norvasc. - The patient will continue the current treatment regimen.  - I encouraged the patient to eat a low-sodium diet to help control blood pressure. - I encouraged the patient to live an active lifestyle and complete activities that increases heart rate to 85% target heart rate at least 5 times per week for one hour.    

## 2020-01-04 DIAGNOSIS — H353221 Exudative age-related macular degeneration, left eye, with active choroidal neovascularization: Secondary | ICD-10-CM | POA: Diagnosis not present

## 2020-01-05 DIAGNOSIS — H353211 Exudative age-related macular degeneration, right eye, with active choroidal neovascularization: Secondary | ICD-10-CM | POA: Diagnosis not present

## 2020-01-30 ENCOUNTER — Other Ambulatory Visit: Payer: Self-pay | Admitting: Internal Medicine

## 2020-02-03 ENCOUNTER — Other Ambulatory Visit: Payer: Self-pay | Admitting: *Deleted

## 2020-02-03 MED ORDER — METOPROLOL TARTRATE 100 MG PO TABS: 100.0000 mg | ORAL_TABLET | Freq: Every day | ORAL | 3 refills | Status: DC

## 2020-02-15 DIAGNOSIS — H353231 Exudative age-related macular degeneration, bilateral, with active choroidal neovascularization: Secondary | ICD-10-CM | POA: Diagnosis not present

## 2020-02-16 DIAGNOSIS — H353231 Exudative age-related macular degeneration, bilateral, with active choroidal neovascularization: Secondary | ICD-10-CM | POA: Diagnosis not present

## 2020-03-22 ENCOUNTER — Encounter: Payer: Self-pay | Admitting: Family Medicine

## 2020-03-22 ENCOUNTER — Ambulatory Visit (INDEPENDENT_AMBULATORY_CARE_PROVIDER_SITE_OTHER): Payer: Medicare HMO | Admitting: Family Medicine

## 2020-03-22 ENCOUNTER — Other Ambulatory Visit: Payer: Self-pay

## 2020-03-22 VITALS — BP 156/84 | HR 58 | Ht 64.0 in | Wt 116.3 lb

## 2020-03-22 DIAGNOSIS — Z23 Encounter for immunization: Secondary | ICD-10-CM | POA: Diagnosis not present

## 2020-03-22 DIAGNOSIS — Z9181 History of falling: Secondary | ICD-10-CM | POA: Diagnosis not present

## 2020-03-22 DIAGNOSIS — H8093 Unspecified otosclerosis, bilateral: Secondary | ICD-10-CM | POA: Diagnosis not present

## 2020-03-22 DIAGNOSIS — I10 Essential (primary) hypertension: Secondary | ICD-10-CM

## 2020-03-22 DIAGNOSIS — Z0000 Encounter for general adult medical examination without abnormal findings: Secondary | ICD-10-CM

## 2020-03-22 DIAGNOSIS — E785 Hyperlipidemia, unspecified: Secondary | ICD-10-CM

## 2020-03-22 MED ORDER — AMLODIPINE BESYLATE 10 MG PO TABS: ORAL_TABLET | ORAL | 3 refills | Status: DC

## 2020-03-22 NOTE — Progress Notes (Signed)
Established Patient Office Visit  SUBJECTIVE:  Subjective  Patient ID: Katrina Bailey, female    DOB: 1924-05-09  Age: 86 y.o. MRN: [id number]  CC:  Chief Complaint  Patient presents with  . Annual Exam    HPI Katrina Bailey is a 86 y.o. female presenting today for an annual exam.   She continues to take all of her prescribed medication without any difficulty. She denies any side effects. She blood pressure today is 158/84. She usually takes her blood pressure medication at night.   She lives alone. She drove here today.   She will get her flu shot today. She is unsure of when she last saw an optometrist.    No past medical history on file.  No past surgical history on file.  No family history on file.  Social History   Socioeconomic History  . Marital status: Widowed    Spouse name: Not on file  . Number of children: Not on file  . Years of education: Not on file  . Highest education level: Not on file  Occupational History  . Not on file  Tobacco Use  . Smoking status: Never Smoker  . Smokeless tobacco: Never Used  Substance and Sexual Activity  . Alcohol use: Never  . Drug use: Never  . Sexual activity: Not Currently  Other Topics Concern  . Not on file  Social History Narrative  . Not on file   Social Determinants of Health   Financial Resource Strain:   . Difficulty of Paying Living Expenses: Not on file  Food Insecurity:   . Worried About Charity fundraiser in the Last Year: Not on file  . Ran Out of Food in the Last Year: Not on file  Transportation Needs:   . Lack of Transportation (Medical): Not on file  . Lack of Transportation (Non-Medical): Not on file  Physical Activity:   . Days of Exercise per Week: Not on file  . Minutes of Exercise per Session: Not on file  Stress:   . Feeling of Stress : Not on file  Social Connections:   . Frequency of Communication with Friends and Family: Not on file  . Frequency of Social Gatherings with  Friends and Family: Not on file  . Attends Religious Services: Not on file  . Active Member of Clubs or Organizations: Not on file  . Attends Archivist Meetings: Not on file  . Marital Status: Not on file  Intimate Partner Violence:   . Fear of Current or Ex-Partner: Not on file  . Emotionally Abused: Not on file  . Physically Abused: Not on file  . Sexually Abused: Not on file     Current Outpatient Medications:  .  amLODipine (NORVASC) 10 MG tablet, Take 1 10 mg tablet Daily, Disp: 90 tablet, Rfl: 3 .  metoprolol tartrate (LOPRESSOR) 100 MG tablet, Take 1 tablet (100 mg total) by mouth daily., Disp: 90 tablet, Rfl: 3 .  Multiple Vitamin (MULTI-VITAMIN) tablet, Take 1 tablet by mouth daily., Disp: , Rfl:  .  quinapril (ACCUPRIL) 40 MG tablet, TAKE 1 TABLET EVERY DAY, Disp: 90 tablet, Rfl: 1 .  simvastatin (ZOCOR) 40 MG tablet, Take 40 mg by mouth daily., Disp: , Rfl:    No Known Allergies  ROS Review of Systems  Constitutional: Negative.   HENT: Positive for hearing loss (wears hearing aids).   Eyes: Negative.   Respiratory: Negative.   Cardiovascular: Negative.   Gastrointestinal: Negative.  Endocrine: Negative.   Genitourinary: Negative.   Musculoskeletal: Negative.   Skin: Negative.   Allergic/Immunologic: Negative.   Neurological: Negative.   Hematological: Negative.   Psychiatric/Behavioral: Negative.   All other systems reviewed and are negative.    OBJECTIVE:    Physical Exam Vitals reviewed.  Constitutional:      Appearance: Normal appearance. She is normal weight.  HENT:     Right Ear: Decreased hearing noted.     Left Ear: Decreased hearing noted.     Ears:     Comments: wears hearing aids. Cataracts present.     Mouth/Throat:     Mouth: Mucous membranes are moist.  Eyes:     Pupils: Pupils are equal, round, and reactive to light.  Neck:     Vascular: No carotid bruit.  Cardiovascular:     Rate and Rhythm: Normal rate and regular  rhythm.     Pulses: Normal pulses.     Heart sounds: Normal heart sounds.  Pulmonary:     Effort: Pulmonary effort is normal.     Breath sounds: Normal breath sounds.  Abdominal:     General: Bowel sounds are normal.     Palpations: Abdomen is soft. There is no hepatomegaly, splenomegaly or mass.     Tenderness: There is no abdominal tenderness.     Hernia: No hernia is present.  Musculoskeletal:        General: No tenderness.     Cervical back: Neck supple.     Right lower leg: No edema.     Left lower leg: No edema.  Skin:    Findings: No rash.  Neurological:     Mental Status: She is alert and oriented to person, place, and time.     Motor: No weakness.  Psychiatric:        Mood and Affect: Mood and affect normal.        Behavior: Behavior normal.     BP (!) 156/84   Pulse (!) 58   Ht _0  (1.626 m)   Wt 116 lb 4.8 oz (52.8 kg)   BMI 19.96 kg/m  Wt Readings from Last 3 Encounters:  03/22/20 116 lb 4.8 oz (52.8 kg)  12/22/19 117 lb 14.4 oz (53.5 kg)    Health Maintenance Due  Topic Date Due  . COVID-19 Vaccine (1) Never done  . TETANUS/TDAP  Never done  . DEXA SCAN  Never done  . PNA vac Low Risk Adult (1 of 2 - PCV13) Never done    There are no preventive care reminders to display for this patient.  CBC Latest Ref Rng & Units 03/22/2020  WBC 3.8 - 10.8 Thousand/uL 6.1  Hemoglobin 11.7 - 15.5 g/dL 13.8  Hematocrit 35 - 45 % 41.9  Platelets 140 - 400 Thousand/uL 236   CMP Latest Ref Rng & Units 03/22/2020  Glucose 65 - 99 mg/dL 81  BUN 7 - 25 mg/dL 16  Creatinine 0.60 - 0.88 mg/dL 0.79  Sodium 135 - 146 mmol/L 141  Potassium 3.5 - 5.3 mmol/L 4.8  Chloride 98 - 110 mmol/L 105  CO2 20 - 32 mmol/L 25  Calcium 8.6 - 10.4 mg/dL 10.0  Total Protein 6.1 - 8.1 g/dL 7.0  Total Bilirubin 0.2 - 1.2 mg/dL 0.7  AST 10 - 35 U/L 17  ALT 6 - 29 U/L 11    No results found for: TSH No results found for: ALBUMIN, ANIONGAP, EGFR, GFR Lab Results  Component Value  Date  CHOL 165 03/22/2020   HDL 66 03/22/2020   LDLCALC 78 03/22/2020   CHOLHDL 2.5 03/22/2020   Lab Results  Component Value Date   TRIG 133 03/22/2020   No results found for: HGBA1C    ASSESSMENT & PLAN:   Problem List Items Addressed This Visit      Cardiovascular and Mediastinum   Essential hypertension    Patient's blood pressure is not within the desired range.  An office visit is recommended. Medication side effects include: no side effects noted Continue current treatment regimen. Medication changes per orders. Increased Norvasc to 10 mg, taking all meds as rx, mild swelling R lower extrimety        Relevant Medications   amLODipine (NORVASC) 10 MG tablet   Other Relevant Orders   COMPLETE METABOLIC PANEL WITH GFR (Completed)   CBC (Completed)     Nervous and Auditory   Otosclerosis of both ears    Hearing amplification not at goal, pt wearing both hearing aides today but communicates through lip reading only, all measures likely have been maximized through otolaryngology         Other   Dyslipidemia    - Pt's HLD is stable on simvistatin (Zocor). - Lab as ordered.       Relevant Orders   Lipid Panel w/o Chol/HDL Ratio   Annual physical exam - Primary    Pt's memory in tact, A and O x 3, Influenza vaccine given today, all other health maint measures not age appropriate.       Need for influenza vaccination    Flu vaccine given today      Relevant Orders   Flu Vaccine QUAD High Dose(Fluad) (Completed)   At risk for falling    Patient fell 3 weeks ago as she tripped on a rug, no injury.          Meds ordered this encounter  Medications  . amLODipine (NORVASC) 10 MG tablet    Sig: Take 1 10 mg tablet Daily    Dispense:  90 tablet    Refill:  3    Follow-up: Return in about 1 month (around 04/22/2020).    Beckie Salts, [place] [address] [place], [place] [address]   By signing my name below, I, [employer], attest that this documentation has been prepared under the direction and in the presence of Beckie Salts, FNP Electronically Signed: Beckie Salts, [place] 03/23/20, 8:43 AM  I personally performed the services described in this documentation, which was SCRIBED in my presence. The recorded information has been reviewed and considered accurate. It has been edited as necessary during review. Beckie Salts, FNP

## 2020-03-22 NOTE — Assessment & Plan Note (Signed)
Patient's blood pressure is not within the desired range.  An office visit is recommended. Medication side effects include: no side effects noted Continue current treatment regimen. Medication changes per orders. Increased Norvasc to 10 mg, taking all meds as rx, mild swelling R lower extrimety

## 2020-03-22 NOTE — Assessment & Plan Note (Signed)
-   Pt's HLD is stable on simvistatin (Zocor). - Lab as ordered.

## 2020-03-22 NOTE — Assessment & Plan Note (Signed)
Flu vaccine given today. 

## 2020-03-22 NOTE — Assessment & Plan Note (Signed)
Hearing amplification not at goal, pt wearing both hearing aides today but communicates through lip reading only, all measures likely have been maximized through otolaryngology

## 2020-03-22 NOTE — Patient Instructions (Addendum)
Please take two of your 5 mg Amlodipine tablets until you run out; when you get your refill, you will get 10 mg tablets and you will only need to take one 10 mg tablet per day. Please call if you have any questions.  ---------------------------------------------------------------------------------------  Understanding Your Risk for Falls  Each year, millions of people have serious injuries from falls. It is important to understand your risk for falling. Talk with your health care provider about your risk and what you can do to lower it. There are actions you can take at home to lower your risk.  If you do have a serious fall, make sure you tell your health care provider. Falling once raises your risk for falling again.  How can falls affect me? Serious injuries from falls are common. These include:  Broken bones, such as hip fractures.  Head injuries, such as traumatic brain injuries (TBI).  Fear of falling can also cause you to avoid activities and stay at home. This can make your muscles weaker and actually raise your risk for a fall.  What can increase my risk? There are a number of risk factors that increase your risk for falling. The more risk factors you have, the higher your risk for falling. Serious injuries from a fall most often happen to people older than age 6. Children and young adults ages 52-29 are also at higher risk. Common risk factors include:  Weakness in the lower body.  Lack (deficiency) of vitamin D.  Being generally weak or confused due to long-term (chronic) illness.  Dizziness or balance problems.  Poor vision.  Medicines that cause dizziness or drowsiness. These can include medicines for your blood pressure, heart, anxiety, insomnia, or edema, as well as pain medicines and muscle relaxants.  Other risk factors include: 1. Drinking alcohol. 2. Having had a fall in the past. 3. Having depression. 4. Foot pain or improper footwear. 5. Working at a  dangerous job. 6. Having any of the following in your home: ? Tripping hazards, such as floor clutter or loose rugs. ? Poor lighting. ? Pets or clutter. 7. Dementia or memory loss.  What actions can I take to lower my risk of falling?      Physical activity Maintain physical fitness. Do strength and balance exercises. Consider taking a regular class to build strength and balance. Yoga and tai chi are good options.  Vision Have your eyes checked every year and your vision prescription updated as needed.  Walking aids and footwear  Wear nonskid shoes. Do not wear high heels.  Do not walk around the house in socks or slippers.  Use a cane or walker as told by your health care provider.  Home safety  Attach secure railings on both sides of your stairs.  Install grab bars for your tub, shower, and toilet. Use a bath mat in your tub or shower.  Use good lighting in all rooms. Keep a flashlight near your bed.  Make sure there is a clear path from your bed to the bathroom. Use night-lights.  Do not use throw rugs. Make sure all carpeting is taped or tacked down securely.  Remove all clutter from walkways and stairways, including extension cords.  Repair uneven or broken steps.  Avoid walking on icy or slippery surfaces. Walk on the grass instead of on icy or slick sidewalks. Where you can, use ice melt to get rid of ice on walkways.  Use a cordless phone.  Questions to ask your health  care provider  Can you help me check my risk for a fall?  Do any of my medicines make me more likely to fall?  Should I take a vitamin D supplement?  What exercises can I do to improve my strength and balance?  Should I make an appointment to have my vision checked?  Do I need a bone density test to check for weak bones or osteoporosis?  Would it help to use a cane or a walker?  Where to find more information  Centers for Disease Control and Prevention, STEADI:  http://www.wolf.info/  Community-Based Fall Prevention Programs: http://www.wolf.info/  National Institute on Aging: ToneConnect.com.ee  Contact a health care provider if:  You fall at home.  You are afraid of falling at home.  You feel weak, drowsy, or dizzy.  Summary  People 61 and older are at high risk for falling. However, older people are not the only ones injured in falls. Children and young adults have a higher-than-normal risk too.  Talk with your health care provider about your risks for falling and how to lower those risks.  Taking certain precautions at home can lower your risk for falling.  If you fall, always tell your health care provider.  This information is not intended to replace advice given to you by your health care provider. Make sure you discuss any questions you have with your health care provider. Document Revised: 12/08/2018 Document Reviewed: 12/08/2018 Elsevier Patient Education  [place].

## 2020-03-22 NOTE — Assessment & Plan Note (Signed)
Pt's memory in tact, A and O x 3, Influenza vaccine given today, all other health maint measures not age appropriate.

## 2020-03-22 NOTE — Assessment & Plan Note (Signed)
Patient fell 3 weeks ago as she tripped on a rug, no injury.

## 2020-03-23 ENCOUNTER — Encounter: Payer: Self-pay | Admitting: Family Medicine

## 2020-03-23 LAB — COMPLETE METABOLIC PANEL WITH GFR
AG Ratio: 1.6 (calc) (ref 1.0–2.5)
ALT: 11 U/L (ref 6–29)
AST: 17 U/L (ref 10–35)
Albumin: 4.3 g/dL (ref 3.6–5.1)
Alkaline phosphatase (APISO): 59 U/L (ref 37–153)
BUN: 16 mg/dL (ref 7–25)
CO2: 25 mmol/L (ref 20–32)
Calcium: 10 mg/dL (ref 8.6–10.4)
Chloride: 105 mmol/L (ref 98–110)
Creat: 0.79 mg/dL (ref 0.60–0.88)
GFR, Est African American: 74 mL/min/{1.73_m2} (ref >=60)
GFR, Est Non African American: 64 mL/min/{1.73_m2} (ref >=60)
Globulin: 2.7 g/dL (calc) (ref 1.9–3.7)
Glucose, Bld: 81 mg/dL (ref 65–99)
Potassium: 4.8 mmol/L (ref 3.5–5.3)
Sodium: 141 mmol/L (ref 135–146)
Total Bilirubin: 0.7 mg/dL (ref 0.2–1.2)
Total Protein: 7 g/dL (ref 6.1–8.1)

## 2020-03-23 LAB — CBC
HCT: 41.9 % (ref 35.0–45.0)
Hemoglobin: 13.8 g/dL (ref 11.7–15.5)
MCH: 29.6 pg (ref 27.0–33.0)
MCHC: 32.9 g/dL (ref 32.0–36.0)
MCV: 89.9 fL (ref 80.0–100.0)
MPV: 11.6 fL (ref 7.5–12.5)
Platelets: 236 10*3/uL (ref 140–400)
RBC: 4.66 10*6/uL (ref 3.80–5.10)
RDW: 13.6 % (ref 11.0–15.0)
WBC: 6.1 10*3/uL (ref 3.8–10.8)

## 2020-03-23 LAB — LIPID PANEL
Cholesterol: 165 mg/dL (ref <200)
HDL: 66 mg/dL (ref >=50)
LDL Cholesterol (Calc): 78 mg/dL (calc)
Non-HDL Cholesterol (Calc): 99 mg/dL (calc) (ref <130)
Total CHOL/HDL Ratio: 2.5 (calc) (ref <5.0)
Triglycerides: 133 mg/dL (ref <150)

## 2020-03-26 ENCOUNTER — Ambulatory Visit: Payer: Medicare HMO | Admitting: Internal Medicine

## 2020-04-04 DIAGNOSIS — H353231 Exudative age-related macular degeneration, bilateral, with active choroidal neovascularization: Secondary | ICD-10-CM | POA: Diagnosis not present

## 2020-04-20 ENCOUNTER — Encounter: Payer: Self-pay | Admitting: Family Medicine

## 2020-04-20 ENCOUNTER — Ambulatory Visit (INDEPENDENT_AMBULATORY_CARE_PROVIDER_SITE_OTHER): Payer: Medicare HMO | Admitting: Family Medicine

## 2020-04-20 ENCOUNTER — Other Ambulatory Visit: Payer: Self-pay

## 2020-04-20 VITALS — BP 186/67 | HR 58 | Ht 64.0 in | Wt 119.4 lb

## 2020-04-20 DIAGNOSIS — Z9181 History of falling: Secondary | ICD-10-CM | POA: Diagnosis not present

## 2020-04-20 DIAGNOSIS — I10 Essential (primary) hypertension: Secondary | ICD-10-CM | POA: Diagnosis not present

## 2020-04-20 NOTE — Assessment & Plan Note (Signed)
No falls in over 6 months.

## 2020-04-20 NOTE — Progress Notes (Signed)
° °Established Patient Office Visit ° °SUBJECTIVE:  °Subjective  °Patient ID: Katrina Bailey, female    DOB: 07/18/1923  Age: 86 y.o. MRN: [id number] ° °CC:  °Chief Complaint  °Patient presents with  °• Follow-up  °  Patient is here for a general 3 month check up. Patient reports no complaints today  ° ° °HPI °Katrina Bailey is a 86 y.o. female presenting today for htn fu.  ° °No past medical history on file. ° °No past surgical history on file. ° °No family history on file. ° °Social History  ° °Socioeconomic History  °• Marital status: Widowed  °  Spouse name: Not on file  °• Number of children: Not on file  °• Years of education: Not on file  °• Highest education level: Not on file  °Occupational History  °• Not on file  °Tobacco Use  °• Smoking status: Never Smoker  °• Smokeless tobacco: Never Used  °Substance and Sexual Activity  °• Alcohol use: Never  °• Drug use: Never  °• Sexual activity: Not Currently  °Other Topics Concern  °• Not on file  °Social History Narrative  °• Not on file  ° °Social Determinants of Health  ° °Financial Resource Strain:   °• Difficulty of Paying Living Expenses: Not on file  °Food Insecurity:   °• Worried About Running Out of Food in the Last Year: Not on file  °• Ran Out of Food in the Last Year: Not on file  °Transportation Needs:   °• Lack of Transportation (Medical): Not on file  °• Lack of Transportation (Non-Medical): Not on file  °Physical Activity:   °• Days of Exercise per Week: Not on file  °• Minutes of Exercise per Session: Not on file  °Stress:   °• Feeling of Stress : Not on file  °Social Connections:   °• Frequency of Communication with Friends and Family: Not on file  °• Frequency of Social Gatherings with Friends and Family: Not on file  °• Attends Religious Services: Not on file  °• Active Member of Clubs or Organizations: Not on file  °• Attends Club or Organization Meetings: Not on file  °• Marital Status: Not on file  °Intimate Partner Violence:   °• Fear of  Current or Ex-Partner: Not on file  °• Emotionally Abused: Not on file  °• Physically Abused: Not on file  °• Sexually Abused: Not on file  ° ° ° °Current Outpatient Medications:  °•  amLODipine (NORVASC) 10 MG tablet, Take 1 10 mg tablet Daily, Disp: 90 tablet, Rfl: 3 °•  metoprolol tartrate (LOPRESSOR) 100 MG tablet, Take 1 tablet (100 mg total) by mouth daily., Disp: 90 tablet, Rfl: 3 °•  Multiple Vitamin (MULTI-VITAMIN) tablet, Take 1 tablet by mouth daily., Disp: , Rfl:  °•  quinapril (ACCUPRIL) 40 MG tablet, TAKE 1 TABLET EVERY DAY, Disp: 90 tablet, Rfl: 1 °•  simvastatin (ZOCOR) 40 MG tablet, Take 40 mg by mouth daily., Disp: , Rfl:   ° °No Known Allergies ° °ROS °Review of Systems  °Constitutional: Negative.   °HENT: Negative.   °Eyes: Negative.   °Respiratory: Negative.   °Cardiovascular: Negative.   ° °  °OBJECTIVE:  °  °Physical Exam °HENT:  °   Head: Normocephalic.  °   Comments: HOH °Eyes:  °   Pupils: Pupils are equal, round, and reactive to light.  °Abdominal:  °   General: Abdomen is flat.  °Neurological:  °   Mental Status: She is   alert.  °Psychiatric:     °   Mood and Affect: Mood normal.  ° ° ° °BP (!) 186/67    Pulse (!) 58    Ht 5' 4" (1.626 m)    Wt 119 lb 6.4 oz (54.2 kg)    BMI 20.49 kg/m²  °Wt Readings from Last 3 Encounters:  °04/20/20 119 lb 6.4 oz (54.2 kg)  °03/22/20 116 lb 4.8 oz (52.8 kg)  °12/22/19 117 lb 14.4 oz (53.5 kg)  ° ° °Health Maintenance Due  °Topic Date Due  °• COVID-19 Vaccine (1) Never done  °• TETANUS/TDAP  Never done  °• DEXA SCAN  Never done  °• PNA vac Low Risk Adult (1 of 2 - PCV13) Never done  ° ° °There are no preventive care reminders to display for this patient. ° °CBC Latest Ref Rng & Units 03/22/2020  °WBC 3.8 - 10.8 Thousand/uL 6.1  °Hemoglobin 11.7 - 15.5 g/dL 13.8  °Hematocrit 35 - 45 % 41.9  °Platelets 140 - 400 Thousand/uL 236  ° °CMP Latest Ref Rng & Units 03/22/2020  °Glucose 65 - 99 mg/dL 81  °BUN 7 - 25 mg/dL 16  °Creatinine 0.60 - 0.88 mg/dL 0.79    °Sodium 135 - 146 mmol/L 141  °Potassium 3.5 - 5.3 mmol/L 4.8  °Chloride 98 - 110 mmol/L 105  °CO2 20 - 32 mmol/L 25  °Calcium 8.6 - 10.4 mg/dL 10.0  °Total Protein 6.1 - 8.1 g/dL 7.0  °Total Bilirubin 0.2 - 1.2 mg/dL 0.7  °AST 10 - 35 U/L 17  °ALT 6 - 29 U/L 11  ° ° °No results found for: TSH °No results found for: ALBUMIN, ANIONGAP, EGFR, GFR °Lab Results  °Component Value Date  ° CHOL 165 03/22/2020  ° HDL 66 03/22/2020  ° LDLCALC 78 03/22/2020  ° CHOLHDL 2.5 03/22/2020  ° °Lab Results  °Component Value Date  ° TRIG 133 03/22/2020  ° °No results found for: HGBA1C ° °  °ASSESSMENT & PLAN:  ° °Problem List Items Addressed This Visit   °  ° Cardiovascular and Mediastinum  ° Essential hypertension - Primary  °  BP still not at goal, no sx of htn at this time. Considering age and fall risk will not attempt to add HTN agent at this time.  °  °  °  ° Other  ° At risk for falling  °  No falls in over 6 months.  °  °  °  ° ° °No orders of the defined types were placed in this encounter. ° ° ° °Follow-up: No follow-ups on file.  ° ° °KANADY, JARROD, FNP °[facility] °[address], [place],  [address] ° ° °

## 2020-04-20 NOTE — Assessment & Plan Note (Signed)
BP still not at goal, no sx of htn at this time. Considering age and fall risk will not attempt to add HTN agent at this time.

## 2020-05-03 ENCOUNTER — Other Ambulatory Visit: Payer: Self-pay

## 2020-05-03 MED ORDER — METOPROLOL TARTRATE 100 MG PO TABS: 100.0000 mg | ORAL_TABLET | Freq: Every day | ORAL | 3 refills | Status: DC

## 2020-05-17 ENCOUNTER — Ambulatory Visit (INDEPENDENT_AMBULATORY_CARE_PROVIDER_SITE_OTHER): Payer: Medicare HMO | Admitting: Internal Medicine

## 2020-05-17 ENCOUNTER — Encounter: Payer: Self-pay | Admitting: Internal Medicine

## 2020-05-17 ENCOUNTER — Other Ambulatory Visit: Payer: Self-pay

## 2020-05-17 VITALS — BP 141/74 | HR 78 | Ht 62.0 in | Wt 118.0 lb

## 2020-05-17 DIAGNOSIS — H8093 Unspecified otosclerosis, bilateral: Secondary | ICD-10-CM

## 2020-05-17 DIAGNOSIS — H6123 Impacted cerumen, bilateral: Secondary | ICD-10-CM

## 2020-05-17 DIAGNOSIS — E785 Hyperlipidemia, unspecified: Secondary | ICD-10-CM | POA: Diagnosis not present

## 2020-05-17 DIAGNOSIS — I10 Essential (primary) hypertension: Secondary | ICD-10-CM | POA: Diagnosis not present

## 2020-05-17 NOTE — Progress Notes (Signed)
Established Patient Office Visit  Subjective:  Patient ID: Katrina Bailey, female    DOB: [birth year]  Age: 86 y.o. MRN: [id number]  CC:  Chief Complaint  Patient presents with  . Cerumen Impaction    Patient is here today for wax removal in both ears    Ear Fullness  There is pain in both ears. This is a recurrent problem. The current episode started 1 to 4 weeks ago. The problem occurs constantly. There has been no fever. The pain is at a severity of 2/10. Associated symptoms include hearing loss. Pertinent negatives include no abdominal pain, coughing, diarrhea, ear discharge, headaches, neck pain, rash, sore throat or vomiting.    Katrina Bailey presents for ear pain  History reviewed. No pertinent past medical history.  History reviewed. No pertinent surgical history.  History reviewed. No pertinent family history.  Social History   Socioeconomic History  . Marital status: Widowed    Spouse name: Not on file  . Number of children: Not on file  . Years of education: Not on file  . Highest education level: Not on file  Occupational History  . Not on file  Tobacco Use  . Smoking status: Never Smoker  . Smokeless tobacco: Never Used  Substance and Sexual Activity  . Alcohol use: Never  . Drug use: Never  . Sexual activity: Not Currently  Other Topics Concern  . Not on file  Social History Narrative  . Not on file   Social Determinants of Health   Financial Resource Strain:   . Difficulty of Paying Living Expenses: Not on file  Food Insecurity:   . Worried About Charity fundraiser in the Last Year: Not on file  . Ran Out of Food in the Last Year: Not on file  Transportation Needs:   . Lack of Transportation (Medical): Not on file  . Lack of Transportation (Non-Medical): Not on file  Physical Activity:   . Days of Exercise per Week: Not on file  . Minutes of Exercise per Session: Not on file  Stress:   . Feeling of Stress : Not on file  Social Connections:    . Frequency of Communication with Friends and Family: Not on file  . Frequency of Social Gatherings with Friends and Family: Not on file  . Attends Religious Services: Not on file  . Active Member of Clubs or Organizations: Not on file  . Attends Archivist Meetings: Not on file  . Marital Status: Not on file  Intimate Partner Violence:   . Fear of Current or Ex-Partner: Not on file  . Emotionally Abused: Not on file  . Physically Abused: Not on file  . Sexually Abused: Not on file     Current Outpatient Medications:  .  amLODipine (NORVASC) 10 MG tablet, Take 1 10 mg tablet Daily, Disp: 90 tablet, Rfl: 3 .  metoprolol tartrate (LOPRESSOR) 100 MG tablet, Take 1 tablet (100 mg total) by mouth daily., Disp: 90 tablet, Rfl: 3 .  Multiple Vitamin (MULTI-VITAMIN) tablet, Take 1 tablet by mouth daily., Disp: , Rfl:  .  quinapril (ACCUPRIL) 40 MG tablet, Take 1 tablet (40 mg total) by mouth daily., Disp: 90 tablet, Rfl: 3 .  simvastatin (ZOCOR) 40 MG tablet, Take 40 mg by mouth daily., Disp: , Rfl:    No Known Allergies  ROS Review of Systems  Constitutional: Negative.   HENT: Positive for hearing loss. Negative for ear discharge and sore throat.  Eyes: Negative.   Respiratory: Negative.  Negative for cough.   Cardiovascular: Negative.   Gastrointestinal: Negative.  Negative for abdominal pain, diarrhea and vomiting.  Endocrine: Negative.   Genitourinary: Negative.   Musculoskeletal: Negative.  Negative for neck pain.  Skin: Negative.  Negative for rash.  Allergic/Immunologic: Negative.   Neurological: Negative.  Negative for headaches.  Hematological: Negative.   Psychiatric/Behavioral: Negative.   All other systems reviewed and are negative.     Objective:    Physical Exam Vitals reviewed.  Constitutional:      Appearance: Normal appearance.  HENT:     Right Ear: There is impacted cerumen.     Left Ear: There is impacted cerumen.     Mouth/Throat:      Mouth: Mucous membranes are moist.  Eyes:     Pupils: Pupils are equal, round, and reactive to light.  Neck:     Vascular: No carotid bruit.  Cardiovascular:     Rate and Rhythm: Normal rate and regular rhythm.     Pulses: Normal pulses.     Heart sounds: Normal heart sounds.  Pulmonary:     Effort: Pulmonary effort is normal.     Breath sounds: Normal breath sounds.  Abdominal:     General: Bowel sounds are normal.     Palpations: Abdomen is soft. There is no hepatomegaly, splenomegaly or mass.     Tenderness: There is no abdominal tenderness.     Hernia: No hernia is present.  Musculoskeletal:        General: No tenderness.     Cervical back: Neck supple.     Right lower leg: No edema.     Left lower leg: No edema.  Skin:    Findings: No rash.  Neurological:     Mental Status: She is alert and oriented to person, place, and time.     Motor: No weakness.  Psychiatric:        Mood and Affect: Mood and affect normal.        Behavior: Behavior normal.     BP (!) 141/74   Pulse 78   Ht 5\' 2"  (1.575 m)   Wt 118 lb (53.5 kg)   BMI 21.58 kg/m  Wt Readings from Last 3 Encounters:  05/17/20 118 lb (53.5 kg)  04/20/20 119 lb 6.4 oz (54.2 kg)  03/22/20 116 lb 4.8 oz (52.8 kg)     Health Maintenance Due  Topic Date Due  . COVID-19 Vaccine (1) Never done  . TETANUS/TDAP  Never done  . DEXA SCAN  Never done  . PNA vac Low Risk Adult (1 of 2 - PCV13) Never done    There are no preventive care reminders to display for this patient. Component     Latest Ref Rng & Units 03/22/2020  Glucose     65 - 99 mg/dL 81  BUN     7 - 25 mg/dL 16  Creatinine     0.60 - 0.88 mg/dL 0.79  GFR, Est Non African American     > OR = 60 mL/min/1.27m2 64  GFR, Est African American     > OR = 60 mL/min/1.34m2 74  BUN/Creatinine Ratio     6 - 22 (calc) NOT APPLICABLE  Sodium     027 - 146 mmol/L 141  Potassium     3.5 - 5.3 mmol/L 4.8  Chloride     98 - 110 mmol/L 105  CO2     20 -  32 mmol/L 25  Calcium     8.6 - 10.4 mg/dL 10.0  Total Protein     6.1 - 8.1 g/dL 7.0  Albumin MSPROF     3.6 - 5.1 g/dL 4.3  Globulin     1.9 - 3.7 g/dL (calc) 2.7  AG Ratio     1.0 - 2.5 (calc) 1.6  Total Bilirubin     0.2 - 1.2 mg/dL 0.7  Alkaline phosphatase (APISO)     37 - 153 U/L 59  AST     10 - 35 U/L 17  ALT     6 - 29 U/L 11   No results found for: TSH   Lab Results  Component Value Date   CHOL 165 03/22/2020   Lab Results  Component Value Date   HDL 66 03/22/2020   Lab Results  Component Value Date   LDLCALC 78 03/22/2020   Lab Results  Component Value Date   TRIG 133 03/22/2020   Lab Results  Component Value Date   CHOLHDL 2.5 03/22/2020   No results found for: HGBA1C    Assessment & Plan:   Problem List Items Addressed This Visit      Cardiovascular and Mediastinum   Essential hypertension    - Today, the patient's blood pressure is well managed on amlodapine. - The patient will continue the current treatment regimen.  - I encouraged the patient to eat a low-sodium diet to help control blood pressure. - I encouraged the patient to live an active lifestyle and complete activities that increases heart rate to 85% target heart rate at least 5 times per week for one hour.            Nervous and Auditory   Otosclerosis of both ears    Patient will be referred to ENT for hearing evaluation.      Excessive ear wax, bilateral - Primary    Both ears were washed and wax was removed about 90%.  Patient tolerated the procedure well.      Relevant Orders   Ear Lavage     Other   Dyslipidemia    Patient was advised to follow low-cholesterol diet walk on a daily basis.  She was advised to continue taking her Zocor 40 mg p.o. daily.         No orders of the defined types were placed in this encounter.   Follow-up: No follow-ups on file.  Ear Cerumen Removal  Date/Time: 05/17/2020 10:11 AM Performed by: Cletis Athens, MD Authorized  by: Cletis Athens, MD   Anesthesia: Local Anesthetic: none Ceruminolytics applied: Ceruminolytics applied prior to the procedure. Location details: right ear and left ear Comments: Both ears were washed and wax removed.  Both ears were examined after wax removal, it shows satisfactory wax removal. Procedure type: irrigation  Sedation: Patient sedated: no      Cletis Athens, MD

## 2020-05-18 ENCOUNTER — Other Ambulatory Visit: Payer: Self-pay | Admitting: *Deleted

## 2020-05-18 MED ORDER — QUINAPRIL HCL 40 MG PO TABS: 40.0000 mg | ORAL_TABLET | Freq: Every day | ORAL | 3 refills | Status: DC

## 2020-05-19 ENCOUNTER — Encounter: Payer: Self-pay | Admitting: Internal Medicine

## 2020-05-19 NOTE — Assessment & Plan Note (Signed)
Patient was advised to follow low-cholesterol diet walk on a daily basis.  She was advised to continue taking her Zocor 40 mg p.o. daily.

## 2020-05-19 NOTE — Assessment & Plan Note (Signed)
Patient will be referred to ENT for hearing evaluation.

## 2020-05-19 NOTE — Assessment & Plan Note (Signed)
-   Today, the patient's blood pressure is well managed on amlodapine. - The patient will continue the current treatment regimen.  - I encouraged the patient to eat a low-sodium diet to help control blood pressure. - I encouraged the patient to live an active lifestyle and complete activities that increases heart rate to 85% target heart rate at least 5 times per week for one hour.     

## 2020-05-19 NOTE — Assessment & Plan Note (Signed)
Both ears were washed and wax was removed about 90%.  Patient tolerated the procedure well.

## 2020-05-21 ENCOUNTER — Ambulatory Visit (INDEPENDENT_AMBULATORY_CARE_PROVIDER_SITE_OTHER): Payer: Medicare HMO | Admitting: Internal Medicine

## 2020-05-21 DIAGNOSIS — I10 Essential (primary) hypertension: Secondary | ICD-10-CM

## 2020-05-21 DIAGNOSIS — M810 Age-related osteoporosis without current pathological fracture: Secondary | ICD-10-CM

## 2020-05-21 DIAGNOSIS — E785 Hyperlipidemia, unspecified: Secondary | ICD-10-CM

## 2020-05-21 MED ORDER — QUINAPRIL HCL 40 MG PO TABS: 40.0000 mg | ORAL_TABLET | Freq: Every day | ORAL | 3 refills | Status: DC

## 2020-05-23 DIAGNOSIS — H353231 Exudative age-related macular degeneration, bilateral, with active choroidal neovascularization: Secondary | ICD-10-CM | POA: Diagnosis not present

## 2020-05-23 DIAGNOSIS — H353211 Exudative age-related macular degeneration, right eye, with active choroidal neovascularization: Secondary | ICD-10-CM | POA: Diagnosis not present

## 2020-05-23 LAB — LIPID PANEL
Cholesterol: 171 mg/dL (ref <200)
HDL: 82 mg/dL (ref >=50)
LDL Cholesterol (Calc): 70 mg/dL (calc)
Non-HDL Cholesterol (Calc): 89 mg/dL (calc) (ref <130)
Total CHOL/HDL Ratio: 2.1 (calc) (ref <5.0)
Triglycerides: 109 mg/dL (ref <150)

## 2020-05-23 LAB — COMPLETE METABOLIC PANEL WITH GFR
AG Ratio: 1.8 (calc) (ref 1.0–2.5)
ALT: 16 U/L (ref 6–29)
AST: 26 U/L (ref 10–35)
Albumin: 4.4 g/dL (ref 3.6–5.1)
Alkaline phosphatase (APISO): 67 U/L (ref 37–153)
BUN: 18 mg/dL (ref 7–25)
CO2: 21 mmol/L (ref 20–32)
Calcium: 9.4 mg/dL (ref 8.6–10.4)
Chloride: 105 mmol/L (ref 98–110)
Creat: 0.71 mg/dL (ref 0.60–0.88)
GFR, Est African American: 84 mL/min/{1.73_m2} (ref >=60)
GFR, Est Non African American: 73 mL/min/{1.73_m2} (ref >=60)
Globulin: 2.5 g/dL (calc) (ref 1.9–3.7)
Glucose, Bld: 68 mg/dL (ref 65–99)
Potassium: 4.1 mmol/L (ref 3.5–5.3)
Sodium: 144 mmol/L (ref 135–146)
Total Bilirubin: 0.8 mg/dL (ref 0.2–1.2)
Total Protein: 6.9 g/dL (ref 6.1–8.1)

## 2020-05-23 LAB — CBC WITH DIFFERENTIAL/PLATELET
Absolute Monocytes: 442 cells/uL (ref 200–950)
Basophils Absolute: 48 cells/uL (ref 0–200)
Basophils Relative: 0.7 %
Eosinophils Absolute: 211 cells/uL (ref 15–500)
Eosinophils Relative: 3.1 %
HCT: 43.9 % (ref 35.0–45.0)
Hemoglobin: 14.1 g/dL (ref 11.7–15.5)
Lymphs Abs: 1863 cells/uL (ref 850–3900)
MCH: 29.6 pg (ref 27.0–33.0)
MCHC: 32.1 g/dL (ref 32.0–36.0)
MCV: 92 fL (ref 80.0–100.0)
MPV: 11.6 fL (ref 7.5–12.5)
Monocytes Relative: 6.5 %
Neutro Abs: 4236 cells/uL (ref 1500–7800)
Neutrophils Relative %: 62.3 %
Platelets: 244 10*3/uL (ref 140–400)
RBC: 4.77 10*6/uL (ref 3.80–5.10)
RDW: 14 % (ref 11.0–15.0)
Total Lymphocyte: 27.4 %
WBC: 6.8 10*3/uL (ref 3.8–10.8)

## 2020-05-23 LAB — TSH: TSH: 1.8 mIU/L (ref 0.40–4.50)

## 2020-07-04 DIAGNOSIS — H353231 Exudative age-related macular degeneration, bilateral, with active choroidal neovascularization: Secondary | ICD-10-CM | POA: Diagnosis not present

## 2020-07-20 ENCOUNTER — Encounter: Payer: Self-pay | Admitting: Family Medicine

## 2020-07-20 ENCOUNTER — Ambulatory Visit (INDEPENDENT_AMBULATORY_CARE_PROVIDER_SITE_OTHER): Payer: Medicare HMO | Admitting: Family Medicine

## 2020-07-20 VITALS — BP 143/70 | HR 66 | Ht 64.0 in | Wt 126.9 lb

## 2020-07-20 DIAGNOSIS — H8093 Unspecified otosclerosis, bilateral: Secondary | ICD-10-CM

## 2020-07-20 DIAGNOSIS — I10 Essential (primary) hypertension: Secondary | ICD-10-CM

## 2020-07-20 NOTE — Progress Notes (Signed)
Established Patient Office Visit  SUBJECTIVE:  Subjective  Patient ID: Katrina Bailey, female    DOB: April 14, 1924  Age: 87 y.o. MRN: [id number]  CC:  Chief Complaint  Patient presents with  . Hypertension    Patient is here for a 3 month BP follow up    HPI Katrina Bailey is a 87 y.o. female presenting today for     History reviewed. No pertinent past medical history.  History reviewed. No pertinent surgical history.  History reviewed. No pertinent family history.  Social History   Socioeconomic History  . Marital status: Widowed    Spouse name: Not on file  . Number of children: Not on file  . Years of education: Not on file  . Highest education level: Not on file  Occupational History  . Not on file  Tobacco Use  . Smoking status: Never Smoker  . Smokeless tobacco: Never Used  Substance and Sexual Activity  . Alcohol use: Never  . Drug use: Never  . Sexual activity: Not Currently  Other Topics Concern  . Not on file  Social History Narrative  . Not on file   Social Determinants of Health   Financial Resource Strain: Not on file  Food Insecurity: Not on file  Transportation Needs: Not on file  Physical Activity: Not on file  Stress: Not on file  Social Connections: Not on file  Intimate Partner Violence: Not on file     Current Outpatient Medications:  .  amLODipine (NORVASC) 10 MG tablet, Take 1 10 mg tablet Daily, Disp: 90 tablet, Rfl: 3 .  metoprolol tartrate (LOPRESSOR) 100 MG tablet, Take 1 tablet (100 mg total) by mouth daily., Disp: 90 tablet, Rfl: 3 .  Multiple Vitamin (MULTI-VITAMIN) tablet, Take 1 tablet by mouth daily., Disp: , Rfl:  .  quinapril (ACCUPRIL) 40 MG tablet, Take 1 tablet (40 mg total) by mouth daily., Disp: 90 tablet, Rfl: 3 .  simvastatin (ZOCOR) 40 MG tablet, Take 40 mg by mouth daily., Disp: , Rfl:    No Known Allergies  ROS Review of Systems  Constitutional: Negative.   HENT: Positive for hearing loss.   Respiratory:  Negative.   Cardiovascular: Positive for leg swelling.  Gastrointestinal: Negative.   Genitourinary: Negative.   Neurological: Negative.   Psychiatric/Behavioral: Negative.      OBJECTIVE:    Physical Exam Vitals and nursing note reviewed.  Constitutional:      Appearance: She is obese.  HENT:     Left Ear: There is impacted cerumen.     Nose: Nose normal.     Mouth/Throat:     Mouth: Mucous membranes are moist.  Eyes:     Pupils: Pupils are equal, round, and reactive to light.  Cardiovascular:     Rate and Rhythm: Regular rhythm. Tachycardia present.     Heart sounds: Murmur heard.    Pulmonary:     Effort: Pulmonary effort is normal.  Musculoskeletal:        General: Normal range of motion.     Cervical back: Normal range of motion.  Skin:    General: Skin is warm.  Neurological:     Mental Status: She is alert.     BP (!) 143/70   Pulse 66   Ht '5\' 4"'  (1.626 m)   Wt 126 lb 14.4 oz (57.6 kg)   BMI 21.78 kg/m  Wt Readings from Last 3 Encounters:  07/20/20 126 lb 14.4 oz (57.6 kg)  05/17/20 118  lb (53.5 kg)  04/20/20 119 lb 6.4 oz (54.2 kg)    Health Maintenance Due  Topic Date Due  . TETANUS/TDAP  Never done  . DEXA SCAN  Never done  . PNA vac Low Risk Adult (1 of 2 - PCV13) Never done  . COVID-19 Vaccine (2 - Pfizer risk 4-dose series) 07/13/2020    There are no preventive care reminders to display for this patient.  CBC Latest Ref Rng & Units 05/21/2020 03/22/2020  WBC 3.8 - 10.8 Thousand/uL 6.8 6.1  Hemoglobin 11.7 - 15.5 g/dL 14.1 13.8  Hematocrit 35.0 - 45.0 % 43.9 41.9  Platelets 140 - 400 Thousand/uL 244 236   CMP Latest Ref Rng & Units 05/21/2020 03/22/2020  Glucose 65 - 99 mg/dL 68 81  BUN 7 - 25 mg/dL 18 16  Creatinine 0.60 - 0.88 mg/dL 0.71 0.79  Sodium 135 - 146 mmol/L 144 141  Potassium 3.5 - 5.3 mmol/L 4.1 4.8  Chloride 98 - 110 mmol/L 105 105  CO2 20 - 32 mmol/L 21 25  Calcium 8.6 - 10.4 mg/dL 9.4 10.0  Total Protein 6.1 - 8.1 g/dL  6.9 7.0  Total Bilirubin 0.2 - 1.2 mg/dL 0.8 0.7  AST 10 - 35 U/L 26 17  ALT 6 - 29 U/L 16 11    Lab Results  Component Value Date   TSH 1.80 05/21/2020   No results found for: ALBUMIN, ANIONGAP, EGFR, GFR Lab Results  Component Value Date   CHOL 171 05/21/2020   CHOL 165 03/22/2020   HDL 82 05/21/2020   HDL 66 03/22/2020   LDLCALC 70 05/21/2020   LDLCALC 78 03/22/2020   CHOLHDL 2.1 05/21/2020   CHOLHDL 2.5 03/22/2020   Lab Results  Component Value Date   TRIG 109 05/21/2020   No results found for: HGBA1C    ASSESSMENT & PLAN:   Problem List Items Addressed This Visit      Cardiovascular and Mediastinum   Essential hypertension - Primary    Patient's blood pressure is within the desired range. Medication side effects include: swelling in ankles Continue current treatment regimen. On no meds for BP and needs none at this time. Dietary sodium restriction.         Nervous and Auditory   Otosclerosis of both ears    Patient has not been back to the ENT specialist since Dr Lavera Guise ref her back, her hearing seems to be decreasing or her hearing aides are not as effective. Plan- Encouraged her to keep her ENT appt.          No orders of the defined types were placed in this encounter.     Follow-up: No follow-ups on file.    Beckie Salts, [place] [address] [place], [place] [address]

## 2020-07-20 NOTE — Assessment & Plan Note (Addendum)
Patient's blood pressure is within the desired range. Medication side effects include: swelling in ankles Patient taking all meds and continuing Salt Restriction.

## 2020-07-20 NOTE — Assessment & Plan Note (Signed)
Patient has not been back to the ENT specialist since Dr Lavera Guise ref her back, her hearing seems to be decreasing or her hearing aides are not as effective. Plan- Encouraged her to keep her ENT appt.

## 2020-08-22 DIAGNOSIS — H353231 Exudative age-related macular degeneration, bilateral, with active choroidal neovascularization: Secondary | ICD-10-CM | POA: Diagnosis not present

## 2020-09-17 ENCOUNTER — Other Ambulatory Visit: Payer: Self-pay | Admitting: *Deleted

## 2020-09-17 MED ORDER — METOPROLOL TARTRATE 100 MG PO TABS: 100.0000 mg | ORAL_TABLET | Freq: Every day | ORAL | 3 refills | Status: DC

## 2020-10-17 DIAGNOSIS — H353231 Exudative age-related macular degeneration, bilateral, with active choroidal neovascularization: Secondary | ICD-10-CM | POA: Diagnosis not present

## 2020-10-18 ENCOUNTER — Other Ambulatory Visit: Payer: Self-pay

## 2020-10-18 ENCOUNTER — Encounter: Payer: Self-pay | Admitting: Family Medicine

## 2020-10-18 ENCOUNTER — Ambulatory Visit (INDEPENDENT_AMBULATORY_CARE_PROVIDER_SITE_OTHER): Payer: Medicare HMO | Admitting: Family Medicine

## 2020-10-18 DIAGNOSIS — I10 Essential (primary) hypertension: Secondary | ICD-10-CM

## 2020-10-18 NOTE — Assessment & Plan Note (Signed)
Patient taking all meds as rx, mild ankle swelling, denies CP or SOB.  Plan- Continue all meds as rx.

## 2020-10-18 NOTE — Progress Notes (Signed)
Established Patient Office Visit  SUBJECTIVE:  Subjective  Patient ID: Katrina Bailey, female    DOB: [birth year]  Age: 87 y.o. MRN: [id number]  CC:  Chief Complaint  Patient presents with  . Hypertension    HPI Katrina Bailey is a 87 y.o. female presenting today for     History reviewed. No pertinent past medical history.  History reviewed. No pertinent surgical history.  History reviewed. No pertinent family history.  Social History   Socioeconomic History  . Marital status: Widowed    Spouse name: Not on file  . Number of children: Not on file  . Years of education: Not on file  . Highest education level: Not on file  Occupational History  . Not on file  Tobacco Use  . Smoking status: Never Smoker  . Smokeless tobacco: Never Used  Substance and Sexual Activity  . Alcohol use: Never  . Drug use: Never  . Sexual activity: Not Currently  Other Topics Concern  . Not on file  Social History Narrative  . Not on file   Social Determinants of Health   Financial Resource Strain: Not on file  Food Insecurity: Not on file  Transportation Needs: Not on file  Physical Activity: Not on file  Stress: Not on file  Social Connections: Not on file  Intimate Partner Violence: Not on file     Current Outpatient Medications:  .  metoprolol tartrate (LOPRESSOR) 100 MG tablet, Take 1 tablet (100 mg total) by mouth daily., Disp: 90 tablet, Rfl: 3 .  Multiple Vitamin (MULTI-VITAMIN) tablet, Take 1 tablet by mouth daily., Disp: , Rfl:  .  quinapril (ACCUPRIL) 40 MG tablet, Take 1 tablet (40 mg total) by mouth daily., Disp: 90 tablet, Rfl: 3 .  simvastatin (ZOCOR) 40 MG tablet, Take 40 mg by mouth daily., Disp: , Rfl:  .  amLODipine (NORVASC) 10 MG tablet, Take 1 10 mg tablet Daily, Disp: 90 tablet, Rfl: 3   No Known Allergies  ROS Review of Systems  Constitutional: Negative.   HENT: Negative.   Respiratory: Negative.   Cardiovascular: Negative.   Genitourinary:  Negative.   Musculoskeletal: Negative.   Skin: Negative.   Psychiatric/Behavioral: Negative.      OBJECTIVE:    Physical Exam Vitals and nursing note reviewed.  HENT:     Head: Normocephalic.  Cardiovascular:     Rate and Rhythm: Normal rate and regular rhythm.     Heart sounds: Murmur heard.    Pulmonary:     Effort: Pulmonary effort is normal.  Musculoskeletal:        General: Swelling present.  Skin:    General: Skin is warm.  Neurological:     Mental Status: She is alert.  Psychiatric:        Mood and Affect: Mood normal.     BP (!) 142/75   Pulse 62   Ht '5\' 3"'  (1.6 m)   Wt 119 lb 8 oz (54.2 kg)   BMI 21.17 kg/m  Wt Readings from Last 3 Encounters:  10/18/20 119 lb 8 oz (54.2 kg)  07/20/20 126 lb 14.4 oz (57.6 kg)  05/17/20 118 lb (53.5 kg)    Health Maintenance Due  Topic Date Due  . TETANUS/TDAP  Never done  . DEXA SCAN  Never done  . PNA vac Low Risk Adult (1 of 2 - PCV13) Never done  . COVID-19 Vaccine (2 - Pfizer risk 4-dose series) 07/13/2020    There are no preventive  care reminders to display for this patient.  CBC Latest Ref Rng & Units 05/21/2020 03/22/2020  WBC 3.8 - 10.8 Thousand/uL 6.8 6.1  Hemoglobin 11.7 - 15.5 g/dL 14.1 13.8  Hematocrit 35.0 - 45.0 % 43.9 41.9  Platelets 140 - 400 Thousand/uL 244 236   CMP Latest Ref Rng & Units 05/21/2020 03/22/2020  Glucose 65 - 99 mg/dL 68 81  BUN 7 - 25 mg/dL 18 16  Creatinine 0.60 - 0.88 mg/dL 0.71 0.79  Sodium 135 - 146 mmol/L 144 141  Potassium 3.5 - 5.3 mmol/L 4.1 4.8  Chloride 98 - 110 mmol/L 105 105  CO2 20 - 32 mmol/L 21 25  Calcium 8.6 - 10.4 mg/dL 9.4 10.0  Total Protein 6.1 - 8.1 g/dL 6.9 7.0  Total Bilirubin 0.2 - 1.2 mg/dL 0.8 0.7  AST 10 - 35 U/L 26 17  ALT 6 - 29 U/L 16 11    Lab Results  Component Value Date   TSH 1.80 05/21/2020   No results found for: ALBUMIN, ANIONGAP, EGFR, GFR Lab Results  Component Value Date   CHOL 171 05/21/2020   CHOL 165 03/22/2020   HDL 82  05/21/2020   HDL 66 03/22/2020   LDLCALC 70 05/21/2020   LDLCALC 78 03/22/2020   CHOLHDL 2.1 05/21/2020   CHOLHDL 2.5 03/22/2020   Lab Results  Component Value Date   TRIG 109 05/21/2020   No results found for: HGBA1C    ASSESSMENT & PLAN:   Problem List Items Addressed This Visit      Cardiovascular and Mediastinum   Essential hypertension    Patient taking all meds as rx, mild ankle swelling, denies CP or SOB.  Plan- Continue all meds as rx.          No orders of the defined types were placed in this encounter.     Follow-up: No follow-ups on file.    Beckie Salts, [place] [address], [place], [place] [address]

## 2020-12-05 DIAGNOSIS — H353231 Exudative age-related macular degeneration, bilateral, with active choroidal neovascularization: Secondary | ICD-10-CM | POA: Diagnosis not present

## 2020-12-28 ENCOUNTER — Other Ambulatory Visit: Payer: Self-pay

## 2020-12-28 MED ORDER — SIMVASTATIN 40 MG PO TABS: 40.0000 mg | ORAL_TABLET | Freq: Every day | ORAL | 1 refills | Status: DC

## 2021-01-23 DIAGNOSIS — H353231 Exudative age-related macular degeneration, bilateral, with active choroidal neovascularization: Secondary | ICD-10-CM | POA: Diagnosis not present

## 2021-01-31 ENCOUNTER — Other Ambulatory Visit: Payer: Self-pay

## 2021-01-31 MED ORDER — SIMVASTATIN 40 MG PO TABS: 40.0000 mg | ORAL_TABLET | Freq: Every day | ORAL | 1 refills | Status: DC

## 2021-02-04 ENCOUNTER — Other Ambulatory Visit: Payer: Self-pay | Admitting: *Deleted

## 2021-02-04 DIAGNOSIS — I10 Essential (primary) hypertension: Secondary | ICD-10-CM

## 2021-02-04 MED ORDER — AMLODIPINE BESYLATE 10 MG PO TABS: ORAL_TABLET | ORAL | 3 refills | Status: DC

## 2021-02-04 MED ORDER — METOPROLOL TARTRATE 100 MG PO TABS: 100.0000 mg | ORAL_TABLET | Freq: Every day | ORAL | 3 refills | Status: DC

## 2021-02-06 ENCOUNTER — Other Ambulatory Visit: Payer: Self-pay | Admitting: *Deleted

## 2021-02-06 MED ORDER — METOPROLOL TARTRATE 100 MG PO TABS: 100.0000 mg | ORAL_TABLET | Freq: Every day | ORAL | 3 refills | Status: DC

## 2021-02-21 ENCOUNTER — Encounter: Payer: Medicare HMO | Admitting: Family Medicine

## 2021-02-27 ENCOUNTER — Encounter: Payer: Self-pay | Admitting: Internal Medicine

## 2021-02-27 ENCOUNTER — Ambulatory Visit (INDEPENDENT_AMBULATORY_CARE_PROVIDER_SITE_OTHER): Payer: Medicare HMO | Admitting: Internal Medicine

## 2021-02-27 VITALS — BP 130/80 | HR 67 | Temp 98.0°F | Ht 64.0 in | Wt 110.1 lb

## 2021-02-27 DIAGNOSIS — H6123 Impacted cerumen, bilateral: Secondary | ICD-10-CM | POA: Diagnosis not present

## 2021-02-27 DIAGNOSIS — Z0000 Encounter for general adult medical examination without abnormal findings: Secondary | ICD-10-CM | POA: Diagnosis not present

## 2021-02-27 DIAGNOSIS — E785 Hyperlipidemia, unspecified: Secondary | ICD-10-CM | POA: Diagnosis not present

## 2021-02-27 DIAGNOSIS — Z23 Encounter for immunization: Secondary | ICD-10-CM

## 2021-02-27 DIAGNOSIS — M47815 Spondylosis without myelopathy or radiculopathy, thoracolumbar region: Secondary | ICD-10-CM | POA: Diagnosis not present

## 2021-02-27 DIAGNOSIS — I10 Essential (primary) hypertension: Secondary | ICD-10-CM

## 2021-02-27 DIAGNOSIS — Z9181 History of falling: Secondary | ICD-10-CM | POA: Diagnosis not present

## 2021-02-27 NOTE — Assessment & Plan Note (Signed)
Patient is hard of hearing she is also wearing a hearing aid her ear examination reveals wax bilaterally so we will ask her to come back and we will get an ear wash.

## 2021-02-27 NOTE — Assessment & Plan Note (Signed)
She denies any back pain

## 2021-02-27 NOTE — Assessment & Plan Note (Signed)
Patient denies any chest pain or shortness of breath there is no history of palpitation or paroxysmal nocturnal dyspnea   patient was advised to follow low-salt low-cholesterol diet    ideally I want to keep systolic blood pressure below 130 mmHg, patient was asked to check blood pressure one times a week and give me a report on that.  Patient will be follow-up in 3 months  or earlier as needed, patient will call me back for any change in the cardiovascular symptoms

## 2021-02-27 NOTE — Assessment & Plan Note (Signed)
Advised to use a stick to walk

## 2021-02-27 NOTE — Assessment & Plan Note (Signed)
Hypercholesterolemia  I advised the patient to follow Mediterranean diet This diet is rich in fruits vegetables and whole grain, and This diet is also rich in fish and lean meat Patient should also eat a handful of almonds or walnuts daily Recent heart study indicated that average follow-up on this kind of diet reduces the cardiovascular mortality by 50 to 70%== 

## 2021-02-27 NOTE — Assessment & Plan Note (Signed)
Patient physical exam is normal for age.  Neck is supple jugular venous pressure is not elevated there is no bruit in the neck.  Heart is regular chest is clear abdominal soft nontender without any hepatosplenomegaly there is no pedal edema no calf tenderness.  Patient does not smoke does not drink.

## 2021-02-27 NOTE — Progress Notes (Signed)
Established Patient Office Visit  Subjective:  Patient ID: Katrina Bailey, female    DOB: Oct 14, 1923  Age: 87 y.o. MRN: [id number]  CC: No chief complaint on file.   HPI  Katrina Bailey presents for physical  History reviewed. No pertinent past medical history.  History reviewed. No pertinent surgical history.  History reviewed. No pertinent family history.  Social History   Socioeconomic History   Marital status: Widowed    Spouse name: Not on file   Number of children: Not on file   Years of education: Not on file   Highest education level: Not on file  Occupational History   Not on file  Tobacco Use   Smoking status: Never   Smokeless tobacco: Never  Substance and Sexual Activity   Alcohol use: Never   Drug use: Never   Sexual activity: Not Currently  Other Topics Concern   Not on file  Social History Narrative   Not on file   Social Determinants of Health   Financial Resource Strain: Not on file  Food Insecurity: Not on file  Transportation Needs: Not on file  Physical Activity: Not on file  Stress: Not on file  Social Connections: Not on file  Intimate Partner Violence: Not on file     Current Outpatient Medications:    amLODipine (NORVASC) 10 MG tablet, Take 1 10 mg tablet Daily, Disp: 90 tablet, Rfl: 3   metoprolol tartrate (LOPRESSOR) 100 MG tablet, Take 1 tablet (100 mg total) by mouth daily., Disp: 30 tablet, Rfl: 3   Multiple Vitamin (MULTI-VITAMIN) tablet, Take 1 tablet by mouth daily., Disp: , Rfl:    quinapril (ACCUPRIL) 40 MG tablet, Take 1 tablet (40 mg total) by mouth daily., Disp: 90 tablet, Rfl: 3   simvastatin (ZOCOR) 40 MG tablet, Take 1 tablet (40 mg total) by mouth daily., Disp: 90 tablet, Rfl: 1   No Known Allergies  ROS Review of Systems  Constitutional: Negative.   HENT: Negative.    Eyes: Negative.   Respiratory: Negative.    Cardiovascular: Negative.   Gastrointestinal: Negative.   Endocrine: Negative.   Genitourinary:  Negative.   Musculoskeletal: Negative.   Skin: Negative.   Allergic/Immunologic: Negative.   Neurological: Negative.   Hematological: Negative.   Psychiatric/Behavioral: Negative.    All other systems reviewed and are negative.    Objective:    Physical Exam Vitals reviewed.  Constitutional:      Appearance: Normal appearance.  HENT:     Mouth/Throat:     Mouth: Mucous membranes are moist.  Eyes:     Pupils: Pupils are equal, round, and reactive to light.  Neck:     Vascular: No carotid bruit.  Cardiovascular:     Rate and Rhythm: Normal rate and regular rhythm.     Pulses: Normal pulses.     Heart sounds: Normal heart sounds.  Pulmonary:     Effort: Pulmonary effort is normal.     Breath sounds: Normal breath sounds.  Abdominal:     General: Bowel sounds are normal.     Palpations: Abdomen is soft. There is no hepatomegaly, splenomegaly or mass.     Tenderness: There is no abdominal tenderness.     Hernia: No hernia is present.  Musculoskeletal:        General: No tenderness.     Cervical back: Neck supple.     Right lower leg: No edema.     Left lower leg: No edema.  Skin:  Findings: No rash.  Neurological:     Mental Status: She is alert and oriented to person, place, and time.     Motor: No weakness.  Psychiatric:        Mood and Affect: Mood and affect normal.        Behavior: Behavior normal.    BP 130/80   Pulse 67   Temp 98 F (36.7 C)   Ht '5\' 4"'$  (1.626 m)   Wt 110 lb 1.6 oz (49.9 kg)   BMI 18.90 kg/m  Wt Readings from Last 3 Encounters:  02/27/21 110 lb 1.6 oz (49.9 kg)  10/18/20 119 lb 8 oz (54.2 kg)  07/20/20 126 lb 14.4 oz (57.6 kg)     Health Maintenance Due  Topic Date Due   TETANUS/TDAP  Never done   Zoster Vaccines- Shingrix (1 of 2) Never done   DEXA SCAN  Never done   COVID-19 Vaccine (4 - Booster for Pfizer series) 09/14/2020   INFLUENZA VACCINE  01/14/2021    There are no preventive care reminders to display for this  patient.  Lab Results  Component Value Date   TSH 1.80 05/21/2020   Lab Results  Component Value Date   WBC 6.8 05/21/2020   HGB 14.1 05/21/2020   HCT 43.9 05/21/2020   MCV 92.0 05/21/2020   PLT 244 05/21/2020   Lab Results  Component Value Date   NA 144 05/21/2020   K 4.1 05/21/2020   CO2 21 05/21/2020   GLUCOSE 68 05/21/2020   BUN 18 05/21/2020   CREATININE 0.71 05/21/2020   BILITOT 0.8 05/21/2020   AST 26 05/21/2020   ALT 16 05/21/2020   PROT 6.9 05/21/2020   CALCIUM 9.4 05/21/2020   Lab Results  Component Value Date   CHOL 171 05/21/2020   Lab Results  Component Value Date   HDL 82 05/21/2020   Lab Results  Component Value Date   LDLCALC 70 05/21/2020   Lab Results  Component Value Date   TRIG 109 05/21/2020   Lab Results  Component Value Date   CHOLHDL 2.1 05/21/2020   No results found for: HGBA1C    Assessment & Plan:   Problem List Items Addressed This Visit       Cardiovascular and Mediastinum   Essential hypertension     Patient denies any chest pain or shortness of breath there is no history of palpitation or paroxysmal nocturnal dyspnea   patient was advised to follow low-salt low-cholesterol diet    ideally I want to keep systolic blood pressure below 130 mmHg, patient was asked to check blood pressure one times a week and give me a report on that.  Patient will be follow-up in 3 months  or earlier as needed, patient will call me back for any change in the cardiovascular symptoms         Nervous and Auditory   Excessive ear wax, bilateral    Patient is hard of hearing she is also wearing a hearing aid her ear examination reveals wax bilaterally so we will ask her to come back and we will get an ear wash.        Musculoskeletal and Integument   Spondylosis of thoracolumbar region without myelopathy or radiculopathy    She denies any back pain        Other   Dyslipidemia    Hypercholesterolemia  I advised the patient to  follow Mediterranean diet This diet is rich in fruits vegetables and whole  grain, and This diet is also rich in fish and lean meat Patient should also eat a handful of almonds or walnuts daily Recent heart study indicated that average follow-up on this kind of diet reduces the cardiovascular mortality by 50 to 70%==      Annual physical exam    Patient physical exam is normal for age.  Neck is supple jugular venous pressure is not elevated there is no bruit in the neck.  Heart is regular chest is clear abdominal soft nontender without any hepatosplenomegaly there is no pedal edema no calf tenderness.  Patient does not smoke does not drink.      At risk for falling - Primary    Advised to use a stick to walk       No orders of the defined types were placed in this encounter.   Follow-up: No follow-ups on file.    Cletis Athens, MD

## 2021-02-28 NOTE — Addendum Note (Signed)
Addended by: Anson [place] R on: 02/28/2021 01:29 PM   Modules accepted: Orders

## 2021-03-13 DIAGNOSIS — H353221 Exudative age-related macular degeneration, left eye, with active choroidal neovascularization: Secondary | ICD-10-CM | POA: Diagnosis not present

## 2021-05-01 DIAGNOSIS — H353221 Exudative age-related macular degeneration, left eye, with active choroidal neovascularization: Secondary | ICD-10-CM | POA: Diagnosis not present

## 2021-05-29 ENCOUNTER — Other Ambulatory Visit: Payer: Self-pay

## 2021-05-29 MED ORDER — QUINAPRIL HCL 40 MG PO TABS: 40.0000 mg | ORAL_TABLET | Freq: Every day | ORAL | 3 refills | Status: DC

## 2021-06-26 DIAGNOSIS — H353221 Exudative age-related macular degeneration, left eye, with active choroidal neovascularization: Secondary | ICD-10-CM | POA: Diagnosis not present

## 2021-07-24 ENCOUNTER — Other Ambulatory Visit: Payer: Self-pay | Admitting: Internal Medicine

## 2021-07-25 ENCOUNTER — Other Ambulatory Visit: Payer: Self-pay

## 2021-07-25 DIAGNOSIS — I10 Essential (primary) hypertension: Secondary | ICD-10-CM

## 2021-07-25 MED ORDER — AMLODIPINE BESYLATE 10 MG PO TABS: ORAL_TABLET | ORAL | 3 refills | Status: DC

## 2021-07-25 MED ORDER — AMLODIPINE BESYLATE 10 MG PO TABS: ORAL_TABLET | ORAL | 0 refills | Status: AC

## 2021-09-04 DIAGNOSIS — H353231 Exudative age-related macular degeneration, bilateral, with active choroidal neovascularization: Secondary | ICD-10-CM | POA: Diagnosis not present

## 2021-10-18 ENCOUNTER — Ambulatory Visit (INDEPENDENT_AMBULATORY_CARE_PROVIDER_SITE_OTHER): Payer: Medicare HMO | Admitting: Nurse Practitioner

## 2021-10-18 ENCOUNTER — Encounter: Payer: Self-pay | Admitting: Nurse Practitioner

## 2021-10-18 VITALS — BP 127/74 | HR 69 | Ht 64.0 in | Wt 117.1 lb

## 2021-10-18 DIAGNOSIS — L918 Other hypertrophic disorders of the skin: Secondary | ICD-10-CM | POA: Diagnosis not present

## 2021-10-18 NOTE — Assessment & Plan Note (Addendum)
Skin tag on the outter left upper leg. ?Will remove the skin tag on Wednesday. ?

## 2021-10-18 NOTE — Progress Notes (Signed)
? ?Established Patient Office Visit ? ?Subjective:  ?Patient ID: Katrina Bailey, female    DOB: May 11, 1924  Age: 88 y.o. MRN: [id number] ? ?CC:  ?Chief Complaint  ?Patient presents with  ? Possible skin tag  ?  Patient is here today for a referral to remove a possible skin tag on her right leg, patient states it bothers her in her sleep but it is not painful. Patient reports no drainage or itching.  ? ? ? ?HPI ? ?Katrina Bailey presents for a skin tag on her right thigh. ? ?HPI  ? ?No past medical history on file. ? ?No past surgical history on file. ? ?No family history on file. ? ?Social History  ? ?Socioeconomic History  ? Marital status: Widowed  ?  Spouse name: Not on file  ? Number of children: Not on file  ? Years of education: Not on file  ? Highest education level: Not on file  ?Occupational History  ? Not on file  ?Tobacco Use  ? Smoking status: Never  ? Smokeless tobacco: Never  ?Substance and Sexual Activity  ? Alcohol use: Never  ? Drug use: Never  ? Sexual activity: Not Currently  ?Other Topics Concern  ? Not on file  ?Social History Narrative  ? Not on file  ? ?Social Determinants of Health  ? ?Financial Resource Strain: Not on file  ?Food Insecurity: Not on file  ?Transportation Needs: Not on file  ?Physical Activity: Not on file  ?Stress: Not on file  ?Social Connections: Not on file  ?Intimate Partner Violence: Not on file  ? ? ? ?Outpatient Medications Prior to Visit  ?Medication Sig Dispense Refill  ? amLODipine (NORVASC) 10 MG tablet Take 1 10 mg tablet Daily 14 tablet 0  ? metoprolol tartrate (LOPRESSOR) 100 MG tablet Take 1 tablet (100 mg total) by mouth daily. 30 tablet 3  ? Multiple Vitamin (MULTI-VITAMIN) tablet Take 1 tablet by mouth daily.    ? quinapril (ACCUPRIL) 40 MG tablet Take 1 tablet (40 mg total) by mouth daily. 90 tablet 3  ? simvastatin (ZOCOR) 40 MG tablet TAKE 1 TABLET (40 MG TOTAL) BY MOUTH DAILY. 90 tablet 1  ? ?No facility-administered medications prior to visit.  ? ? ?No  Known Allergies ? ?ROS ?Review of Systems  ?Constitutional:  Negative for activity change and chills.  ?HENT:  Positive for hearing loss. Negative for congestion and rhinorrhea.   ?Eyes:  Negative for discharge and redness.  ?Respiratory:  Negative for apnea and shortness of breath.   ?Cardiovascular:  Negative for chest pain and palpitations.  ?Gastrointestinal:  Negative for abdominal distention, constipation and vomiting.  ?Genitourinary:  Negative for difficulty urinating, flank pain and pelvic pain.  ?Musculoskeletal:  Negative for arthralgias and joint swelling.  ?Skin:  Negative for color change and pallor.  ?     Skin tag on left upper leg  ?Neurological:  Negative for dizziness, facial asymmetry, light-headedness and headaches.  ?Psychiatric/Behavioral:  Negative for agitation, behavioral problems, confusion and decreased concentration.   ? ?  ?Objective:  ?  ?Physical Exam ?Constitutional:   ?   Appearance: Normal appearance. She is normal weight.  ?HENT:  ?   Head: Normocephalic.  ?   Right Ear: Tympanic membrane normal.  ?   Left Ear: Tympanic membrane normal.  ?   Nose: Nose normal.  ?   Mouth/Throat:  ?   Mouth: Mucous membranes are moist.  ?   Pharynx: Oropharynx is clear.  ?  Eyes:  ?   Extraocular Movements: Extraocular movements intact.  ?   Conjunctiva/sclera: Conjunctivae normal.  ?   Pupils: Pupils are equal, round, and reactive to light.  ?Cardiovascular:  ?   Rate and Rhythm: Normal rate and regular rhythm.  ?   Pulses: Normal pulses.  ?   Heart sounds: Normal heart sounds.  ?Pulmonary:  ?   Effort: Pulmonary effort is normal.  ?   Breath sounds: Normal breath sounds.  ?Abdominal:  ?   General: Abdomen is flat. Bowel sounds are normal.  ?   Palpations: Abdomen is soft.  ?Musculoskeletal:     ?   General: Normal range of motion.  ?   Cervical back: Normal range of motion.  ?   Comments: Skin tag left upper leg  ?Skin: ?   General: Skin is warm.  ?   Capillary Refill: Capillary refill takes less  than 2 seconds.  ?Neurological:  ?   General: No focal deficit present.  ?   Mental Status: She is alert and oriented to person, place, and time. Mental status is at baseline.  ?Psychiatric:     ?   Mood and Affect: Mood normal.     ?   Behavior: Behavior normal.     ?   Thought Content: Thought content normal.     ?   Judgment: Judgment normal.  ? ? ?BP 127/74   Pulse 69   Ht '5\' 4"'$  (1.626 m)   Wt 117 lb 1.6 oz (53.1 kg)   BMI 20.10 kg/m?  ?Wt Readings from Last 3 Encounters:  ?10/18/21 117 lb 1.6 oz (53.1 kg)  ?02/27/21 110 lb 1.6 oz (49.9 kg)  ?10/18/20 119 lb 8 oz (54.2 kg)  ? ? ? ?Health Maintenance Due  ?Topic Date Due  ? TETANUS/TDAP  Never done  ? Zoster Vaccines- Shingrix (1 of 2) Never done  ? DEXA SCAN  Never done  ? COVID-19 Vaccine (4 - Booster for Pfizer series) 08/17/2020  ? ? ?There are no preventive care reminders to display for this patient. ? ?Lab Results  ?Component Value Date  ? TSH 1.80 05/21/2020  ? ?Lab Results  ?Component Value Date  ? WBC 6.8 05/21/2020  ? HGB 14.1 05/21/2020  ? HCT 43.9 05/21/2020  ? MCV 92.0 05/21/2020  ? PLT 244 05/21/2020  ? ?Lab Results  ?Component Value Date  ? NA 144 05/21/2020  ? K 4.1 05/21/2020  ? CO2 21 05/21/2020  ? GLUCOSE 68 05/21/2020  ? BUN 18 05/21/2020  ? CREATININE 0.71 05/21/2020  ? BILITOT 0.8 05/21/2020  ? AST 26 05/21/2020  ? ALT 16 05/21/2020  ? PROT 6.9 05/21/2020  ? CALCIUM 9.4 05/21/2020  ? ?Lab Results  ?Component Value Date  ? CHOL 171 05/21/2020  ? ?Lab Results  ?Component Value Date  ? HDL 82 05/21/2020  ? ?Lab Results  ?Component Value Date  ? [place] 70 05/21/2020  ? ?Lab Results  ?Component Value Date  ? TRIG 109 05/21/2020  ? ?Lab Results  ?Component Value Date  ? CHOLHDL 2.1 05/21/2020  ? ?No results found for: HGBA1C ? ?  ?Assessment & Plan:  ? ?Problem List Items Addressed This Visit   ? ?  ? Musculoskeletal and Integument  ? Skin tag - Primary  ?  Skin tag on the outter left upper leg. ?Will remove the skin tag on Wednesday. ? ?  ?   ? ? ? ?No orders of the defined types were  placed in this encounter. ? ? ? ?Follow-up: No follow-ups on file.  ? ? ?Theresia Lo, NP ?

## 2021-10-23 ENCOUNTER — Ambulatory Visit (INDEPENDENT_AMBULATORY_CARE_PROVIDER_SITE_OTHER): Payer: Medicare HMO | Admitting: Internal Medicine

## 2021-10-23 DIAGNOSIS — I10 Essential (primary) hypertension: Secondary | ICD-10-CM

## 2021-10-23 DIAGNOSIS — Z0100 Encounter for examination of eyes and vision without abnormal findings: Secondary | ICD-10-CM | POA: Diagnosis not present

## 2021-10-23 DIAGNOSIS — H353221 Exudative age-related macular degeneration, left eye, with active choroidal neovascularization: Secondary | ICD-10-CM | POA: Diagnosis not present

## 2021-10-23 NOTE — Progress Notes (Signed)
This patient was not seen.

## 2021-11-04 ENCOUNTER — Ambulatory Visit (INDEPENDENT_AMBULATORY_CARE_PROVIDER_SITE_OTHER): Payer: Medicare HMO | Admitting: Internal Medicine

## 2021-11-04 ENCOUNTER — Encounter: Payer: Self-pay | Admitting: Internal Medicine

## 2021-11-04 VITALS — BP 130/74 | HR 72 | Ht 64.0 in | Wt 117.0 lb

## 2021-11-04 DIAGNOSIS — Z9181 History of falling: Secondary | ICD-10-CM | POA: Diagnosis not present

## 2021-11-04 DIAGNOSIS — I10 Essential (primary) hypertension: Secondary | ICD-10-CM

## 2021-11-04 DIAGNOSIS — H8093 Unspecified otosclerosis, bilateral: Secondary | ICD-10-CM | POA: Diagnosis not present

## 2021-11-04 DIAGNOSIS — E785 Hyperlipidemia, unspecified: Secondary | ICD-10-CM | POA: Diagnosis not present

## 2021-11-04 NOTE — Assessment & Plan Note (Signed)
I advised the patient to use a stick to walk

## 2021-11-04 NOTE — Assessment & Plan Note (Signed)
Blood pressure is stable 
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
## 2021-11-04 NOTE — Assessment & Plan Note (Signed)
She was advised to follow low-cholesterol diet

## 2021-11-04 NOTE — Assessment & Plan Note (Signed)
She is very hard of hearing I told her to go to an ENT

## 2021-11-16 ENCOUNTER — Other Ambulatory Visit: Payer: Self-pay | Admitting: Internal Medicine

## 2021-12-18 DIAGNOSIS — H353221 Exudative age-related macular degeneration, left eye, with active choroidal neovascularization: Secondary | ICD-10-CM | POA: Diagnosis not present

## 2022-02-12 DIAGNOSIS — H353221 Exudative age-related macular degeneration, left eye, with active choroidal neovascularization: Secondary | ICD-10-CM | POA: Diagnosis not present

## 2022-02-21 ENCOUNTER — Other Ambulatory Visit: Payer: Self-pay | Admitting: Internal Medicine

## 2022-04-16 DIAGNOSIS — H353221 Exudative age-related macular degeneration, left eye, with active choroidal neovascularization: Secondary | ICD-10-CM | POA: Diagnosis not present

## 2022-07-02 DIAGNOSIS — H353221 Exudative age-related macular degeneration, left eye, with active choroidal neovascularization: Secondary | ICD-10-CM | POA: Diagnosis not present

## 2022-07-21 ENCOUNTER — Other Ambulatory Visit: Payer: Self-pay | Admitting: Internal Medicine

## 2022-07-21 DIAGNOSIS — I10 Essential (primary) hypertension: Secondary | ICD-10-CM

## 2022-09-10 DIAGNOSIS — H353221 Exudative age-related macular degeneration, left eye, with active choroidal neovascularization: Secondary | ICD-10-CM | POA: Diagnosis not present

## 2022-11-19 DIAGNOSIS — H353221 Exudative age-related macular degeneration, left eye, with active choroidal neovascularization: Secondary | ICD-10-CM | POA: Diagnosis not present

## 2022-11-19 DIAGNOSIS — H43813 Vitreous degeneration, bilateral: Secondary | ICD-10-CM | POA: Diagnosis not present

## 2022-11-19 DIAGNOSIS — H353211 Exudative age-related macular degeneration, right eye, with active choroidal neovascularization: Secondary | ICD-10-CM | POA: Diagnosis not present

## 2022-11-19 DIAGNOSIS — H2513 Age-related nuclear cataract, bilateral: Secondary | ICD-10-CM | POA: Diagnosis not present

## 2022-11-19 DIAGNOSIS — H353231 Exudative age-related macular degeneration, bilateral, with active choroidal neovascularization: Secondary | ICD-10-CM | POA: Diagnosis not present

## 2023-01-07 DIAGNOSIS — E785 Hyperlipidemia, unspecified: Secondary | ICD-10-CM | POA: Diagnosis not present

## 2023-01-07 DIAGNOSIS — I10 Essential (primary) hypertension: Secondary | ICD-10-CM | POA: Diagnosis not present

## 2023-01-07 DIAGNOSIS — H524 Presbyopia: Secondary | ICD-10-CM | POA: Diagnosis not present

## 2023-02-04 DIAGNOSIS — H353221 Exudative age-related macular degeneration, left eye, with active choroidal neovascularization: Secondary | ICD-10-CM | POA: Diagnosis not present

## 2023-04-15 DIAGNOSIS — H353221 Exudative age-related macular degeneration, left eye, with active choroidal neovascularization: Secondary | ICD-10-CM | POA: Diagnosis not present

## 2023-05-01 DIAGNOSIS — Z8669 Personal history of other diseases of the nervous system and sense organs: Secondary | ICD-10-CM | POA: Diagnosis not present

## 2023-05-01 DIAGNOSIS — I10 Essential (primary) hypertension: Secondary | ICD-10-CM | POA: Diagnosis not present

## 2023-05-01 DIAGNOSIS — Z23 Encounter for immunization: Secondary | ICD-10-CM | POA: Diagnosis not present

## 2023-05-01 DIAGNOSIS — H8093 Unspecified otosclerosis, bilateral: Secondary | ICD-10-CM | POA: Diagnosis not present

## 2023-06-24 DIAGNOSIS — H353221 Exudative age-related macular degeneration, left eye, with active choroidal neovascularization: Secondary | ICD-10-CM | POA: Diagnosis not present

## 2023-07-29 DIAGNOSIS — Z8669 Personal history of other diseases of the nervous system and sense organs: Secondary | ICD-10-CM | POA: Diagnosis not present

## 2023-07-29 DIAGNOSIS — E785 Hyperlipidemia, unspecified: Secondary | ICD-10-CM | POA: Diagnosis not present

## 2023-07-29 DIAGNOSIS — H8093 Unspecified otosclerosis, bilateral: Secondary | ICD-10-CM | POA: Diagnosis not present

## 2023-07-29 DIAGNOSIS — I10 Essential (primary) hypertension: Secondary | ICD-10-CM | POA: Diagnosis not present

## 2023-11-19 DIAGNOSIS — H353211 Exudative age-related macular degeneration, right eye, with active choroidal neovascularization: Secondary | ICD-10-CM | POA: Diagnosis not present

## 2023-11-19 DIAGNOSIS — H2513 Age-related nuclear cataract, bilateral: Secondary | ICD-10-CM | POA: Diagnosis not present

## 2023-11-19 DIAGNOSIS — H353231 Exudative age-related macular degeneration, bilateral, with active choroidal neovascularization: Secondary | ICD-10-CM | POA: Diagnosis not present

## 2023-11-19 DIAGNOSIS — H353221 Exudative age-related macular degeneration, left eye, with active choroidal neovascularization: Secondary | ICD-10-CM | POA: Diagnosis not present

## 2024-02-03 DIAGNOSIS — H353221 Exudative age-related macular degeneration, left eye, with active choroidal neovascularization: Secondary | ICD-10-CM | POA: Diagnosis not present

## 2024-02-17 DIAGNOSIS — Z0100 Encounter for examination of eyes and vision without abnormal findings: Secondary | ICD-10-CM | POA: Diagnosis not present

## 2024-02-17 DIAGNOSIS — H2513 Age-related nuclear cataract, bilateral: Secondary | ICD-10-CM | POA: Diagnosis not present

## 2024-02-17 DIAGNOSIS — H353231 Exudative age-related macular degeneration, bilateral, with active choroidal neovascularization: Secondary | ICD-10-CM | POA: Diagnosis not present

## 2024-02-17 DIAGNOSIS — H353114 Nonexudative age-related macular degeneration, right eye, advanced atrophic with subfoveal involvement: Secondary | ICD-10-CM | POA: Diagnosis not present

## 2024-02-23 ENCOUNTER — Encounter: Payer: Self-pay | Admitting: Ophthalmology

## 2024-02-23 DIAGNOSIS — H2512 Age-related nuclear cataract, left eye: Secondary | ICD-10-CM | POA: Diagnosis not present

## 2024-02-24 NOTE — Anesthesia Preprocedure Evaluation (Addendum)
 Anesthesia Evaluation  Patient identified by MRN, date of birth, ID band Patient awake    Reviewed: Allergy & Precautions, H&P , NPO status , Patient's Chart, lab work & pertinent test results  Airway        Dental   Pulmonary neg pulmonary ROS, former smoker          Cardiovascular hypertension, negative cardio ROS      Neuro/Psych  PSYCHIATRIC DISORDERS  Depression    negative neurological ROS  negative psych ROS   GI/Hepatic negative GI ROS, Neg liver ROS,,,  Endo/Other  negative endocrine ROS    Renal/GU negative Renal ROS  negative genitourinary   Musculoskeletal negative musculoskeletal ROS (+) Arthritis ,    Abdominal   Peds negative pediatric ROS (+)  Hematology negative hematology ROS (+)   Anesthesia Other Findings HTN Arthritis Depression   Reproductive/Obstetrics negative OB ROS                              Anesthesia Physical Anesthesia Plan  ASA: 2  Anesthesia Plan: MAC   Post-op Pain Management:    Induction: Intravenous  PONV Risk Score and Plan:   Airway Management Planned: Natural Airway and Nasal Cannula  Additional Equipment:   Intra-op Plan:   Post-operative Plan:   Informed Consent: I have reviewed the patients History and Physical, chart, labs and discussed the procedure including the risks, benefits and alternatives for the proposed anesthesia with the patient or authorized representative who has indicated his/her understanding and acceptance.     Dental Advisory Given  Plan Discussed with: Anesthesiologist, CRNA and Surgeon  Anesthesia Plan Comments: (Patient consented for risks of anesthesia including but not limited to:  - adverse reactions to medications - damage to eyes, teeth, lips or other oral mucosa - nerve damage due to positioning  - sore throat or hoarseness - Damage to heart, brain, nerves, lungs, other parts of body or loss of  life  Patient voiced understanding and assent.)         Anesthesia Quick Evaluation

## 2024-02-25 NOTE — Discharge Instructions (Signed)

## 2024-02-29 ENCOUNTER — Ambulatory Visit: Payer: Self-pay | Admitting: Anesthesiology

## 2024-02-29 ENCOUNTER — Ambulatory Visit
Admission: RE | Admit: 2024-02-29 | Discharge: 2024-02-29 | Disposition: A | Discharge status: Home / Self Care | Attending: Ophthalmology | Admitting: Ophthalmology

## 2024-02-29 ENCOUNTER — Encounter
Admission: RE | Disposition: A | Discharge status: Home / Self Care | Payer: Self-pay | Source: Home / Self Care | Attending: Ophthalmology

## 2024-02-29 ENCOUNTER — Encounter: Payer: Self-pay | Admitting: Ophthalmology

## 2024-02-29 ENCOUNTER — Other Ambulatory Visit: Payer: Self-pay

## 2024-02-29 DIAGNOSIS — Z538 Procedure and treatment not carried out for other reasons: Secondary | ICD-10-CM | POA: Diagnosis not present

## 2024-02-29 DIAGNOSIS — H2512 Age-related nuclear cataract, left eye: Secondary | ICD-10-CM | POA: Diagnosis not present

## 2024-02-29 DIAGNOSIS — I10 Essential (primary) hypertension: Secondary | ICD-10-CM | POA: Diagnosis not present

## 2024-02-29 DIAGNOSIS — Z87891 Personal history of nicotine dependence: Secondary | ICD-10-CM | POA: Insufficient documentation

## 2024-02-29 DIAGNOSIS — F32A Depression, unspecified: Secondary | ICD-10-CM | POA: Insufficient documentation

## 2024-02-29 DIAGNOSIS — M1990 Unspecified osteoarthritis, unspecified site: Secondary | ICD-10-CM | POA: Insufficient documentation

## 2024-02-29 HISTORY — DX: Unspecified osteoarthritis, unspecified site: M19.90

## 2024-02-29 HISTORY — DX: Depression, unspecified: F32.A

## 2024-02-29 HISTORY — DX: Essential (primary) hypertension: I10

## 2024-02-29 SURGERY — PHACOEMULSIFICATION, CATARACT, WITH IOL INSERTION
Anesthesia: Topical

## 2024-02-29 MED ORDER — ARMC OPHTHALMIC DILATING DROPS
OPHTHALMIC | Status: AC
  Filled 2024-02-29: qty 0.5

## 2024-02-29 MED ORDER — TETRACAINE HCL 0.5 % OP SOLN
OPHTHALMIC | Status: AC
  Filled 2024-02-29: qty 4

## 2024-02-29 MED ORDER — FENTANYL CITRATE (PF) 100 MCG/2ML IJ SOLN
INTRAMUSCULAR | Status: AC
  Filled 2024-02-29: qty 2

## 2024-02-29 SURGICAL SUPPLY — 12 items
CANNULA ANT/CHMB 27G (MISCELLANEOUS) IMPLANT
DISSECTOR HYDRO NUCLEUS 50X22 (MISCELLANEOUS) ×2 IMPLANT
FEE CATARACT SUITE SIGHTPATH (MISCELLANEOUS) ×2 IMPLANT
GLOVE PI ULTRA LF STRL 7.5 (GLOVE) ×2 IMPLANT
GLOVE SURG SYN 6.5 PF PI BL (GLOVE) ×2 IMPLANT
GLOVE SURG SYN 8.5 PF PI BL (GLOVE) ×2 IMPLANT
NDL FILTER BLUNT 18X1 1/2 (NEEDLE) ×1 IMPLANT
NEEDLE FILTER BLUNT 18X1 1/2 (NEEDLE) ×1 IMPLANT
PACK VIT ANT 23G (MISCELLANEOUS) IMPLANT
RING MALYGIN (MISCELLANEOUS) IMPLANT
SUTURE EHLN 10-0 CS-B-6CS-B-6 (SUTURE) IMPLANT
SYR 3ML LL SCALE MARK (SYRINGE) ×2 IMPLANT

## 2024-02-29 NOTE — Progress Notes (Signed)
 Patient unable to consent for procedure.  Charge nurses, providers and Anesthesia all aware.

## 2024-02-29 NOTE — H&P (Signed)
 I did not evaluate the patient today.  The patient presented for cataract surgery, accompanied by family but did not have the requisite HCPOA documentation.    Katrina Bailey

## 2024-03-17 ENCOUNTER — Encounter: Payer: Self-pay | Admitting: Ophthalmology

## 2024-03-17 NOTE — Anesthesia Preprocedure Evaluation (Addendum)
 Anesthesia Evaluation  Patient identified by MRN, date of birth, ID band Patient awake    Reviewed: Allergy & Precautions, H&P , NPO status , Patient's Chart, lab work & pertinent test results  Airway Mallampati: IV  TM Distance: <3 FB Neck ROM: Full   Comment: 1 FB TMD, would likely be difficult anterior airway if intubation needed Dental no notable dental hx. (+) Missing, Poor Dentition, Chipped   Pulmonary former smoker   Pulmonary exam normal breath sounds clear to auscultation       Cardiovascular hypertension, Normal cardiovascular exam Rhythm:Regular Rate:Normal     Neuro/Psych  PSYCHIATRIC DISORDERS  Depression    negative neurological ROS  negative psych ROS   GI/Hepatic negative GI ROS, Neg liver ROS,,,  Endo/Other  negative endocrine ROS    Renal/GU negative Renal ROS  negative genitourinary   Musculoskeletal negative musculoskeletal ROS (+) Arthritis ,    Abdominal   Peds negative pediatric ROS (+)  Hematology negative hematology ROS (+)   Anesthesia Other Findings HTN Arthritis Depression Hard of hearing  Reproductive/Obstetrics negative OB ROS                              Anesthesia Physical Anesthesia Plan  ASA: 2  Anesthesia Plan: MAC   Post-op Pain Management:    Induction: Intravenous  PONV Risk Score and Plan:   Airway Management Planned: Natural Airway and Nasal Cannula  Additional Equipment:   Intra-op Plan:   Post-operative Plan:   Informed Consent: I have reviewed the patients History and Physical, chart, labs and discussed the procedure including the risks, benefits and alternatives for the proposed anesthesia with the patient or authorized representative who has indicated his/her understanding and acceptance.     Dental Advisory Given  Plan Discussed with: Anesthesiologist, CRNA and Surgeon  Anesthesia Plan Comments: (Patient consented for  risks of anesthesia including but not limited to:  - adverse reactions to medications - damage to eyes, teeth, lips or other oral mucosa - nerve damage due to positioning  - sore throat or hoarseness - Damage to heart, brain, nerves, lungs, other parts of body or loss of life  Patient voiced understanding and assent.)         Anesthesia Quick Evaluation

## 2024-03-17 NOTE — Discharge Instructions (Signed)

## 2024-04-04 ENCOUNTER — Encounter: Payer: Self-pay | Admitting: Ophthalmology

## 2024-04-04 ENCOUNTER — Ambulatory Visit
Admission: RE | Admit: 2024-04-04 | Discharge: 2024-04-04 | Disposition: A | Discharge status: Home / Self Care | Attending: Ophthalmology | Admitting: Ophthalmology

## 2024-04-04 ENCOUNTER — Encounter
Admission: RE | Disposition: A | Discharge status: Home / Self Care | Payer: Self-pay | Source: Home / Self Care | Attending: Ophthalmology

## 2024-04-04 ENCOUNTER — Other Ambulatory Visit: Payer: Self-pay

## 2024-04-04 ENCOUNTER — Ambulatory Visit: Payer: Self-pay | Admitting: Anesthesiology

## 2024-04-04 DIAGNOSIS — H25042 Posterior subcapsular polar age-related cataract, left eye: Secondary | ICD-10-CM | POA: Diagnosis not present

## 2024-04-04 DIAGNOSIS — I10 Essential (primary) hypertension: Secondary | ICD-10-CM | POA: Insufficient documentation

## 2024-04-04 DIAGNOSIS — H2512 Age-related nuclear cataract, left eye: Secondary | ICD-10-CM | POA: Diagnosis not present

## 2024-04-04 DIAGNOSIS — Z87891 Personal history of nicotine dependence: Secondary | ICD-10-CM | POA: Diagnosis not present

## 2024-04-04 DIAGNOSIS — H25012 Cortical age-related cataract, left eye: Secondary | ICD-10-CM | POA: Diagnosis not present

## 2024-04-04 HISTORY — DX: Unspecified hearing loss, unspecified ear: H91.90

## 2024-04-04 SURGERY — PHACOEMULSIFICATION, CATARACT, WITH IOL INSERTION
Anesthesia: Monitor Anesthesia Care | Site: Eye | Laterality: Left

## 2024-04-04 MED ORDER — BRIMONIDINE TARTRATE-TIMOLOL 0.2-0.5 % OP SOLN
OPHTHALMIC | Status: DC | PRN
  Administered 2024-04-04: 1 [drp] via OPHTHALMIC

## 2024-04-04 MED ORDER — LIDOCAINE HCL (PF) 2 % IJ SOLN
INTRAOCULAR | Status: DC | PRN
  Administered 2024-04-04: 4 mL via INTRAOCULAR

## 2024-04-04 MED ORDER — FENTANYL CITRATE (PF) 100 MCG/2ML IJ SOLN
INTRAMUSCULAR | Status: DC | PRN
  Administered 2024-04-04: 25 ug via INTRAVENOUS

## 2024-04-04 MED ORDER — MOXIFLOXACIN HCL 0.5 % OP SOLN
OPHTHALMIC | Status: DC | PRN
  Administered 2024-04-04: .2 mL via OPHTHALMIC

## 2024-04-04 MED ORDER — ARMC OPHTHALMIC DILATING DROPS
1.0000 | OPHTHALMIC | Status: AC | PRN
  Administered 2024-04-04 (×3): 1 via OPHTHALMIC

## 2024-04-04 MED ORDER — TETRACAINE HCL 0.5 % OP SOLN
OPHTHALMIC | Status: AC
  Filled 2024-04-04: qty 4

## 2024-04-04 MED ORDER — ARMC OPHTHALMIC DILATING DROPS
OPHTHALMIC | Status: AC
  Filled 2024-04-04: qty 0.5

## 2024-04-04 MED ORDER — SIGHTPATH DOSE#1 NA HYALUR & NA CHOND-NA HYALUR IO KIT
PACK | INTRAOCULAR | Status: DC | PRN
  Administered 2024-04-04: 1 via OPHTHALMIC

## 2024-04-04 MED ORDER — TETRACAINE HCL 0.5 % OP SOLN
1.0000 [drp] | OPHTHALMIC | Status: AC | PRN
  Administered 2024-04-04 (×3): 1 [drp] via OPHTHALMIC

## 2024-04-04 MED ORDER — SIGHTPATH DOSE#1 BSS IO SOLN
INTRAOCULAR | Status: DC | PRN
  Administered 2024-04-04: 122 mL via OPHTHALMIC

## 2024-04-04 MED ORDER — FENTANYL CITRATE (PF) 100 MCG/2ML IJ SOLN
INTRAMUSCULAR | Status: AC
  Filled 2024-04-04: qty 2

## 2024-04-04 MED ORDER — SIGHTPATH DOSE#1 BSS IO SOLN
INTRAOCULAR | Status: DC | PRN
  Administered 2024-04-04: 15 mL via INTRAOCULAR

## 2024-04-04 MED ORDER — LACTATED RINGERS IV SOLN: INTRAVENOUS | Status: DC

## 2024-04-04 SURGICAL SUPPLY — 9 items
DISSECTOR HYDRO NUCLEUS 50X22 (MISCELLANEOUS) ×2 IMPLANT
FEE CATARACT SUITE SIGHTPATH (MISCELLANEOUS) ×2 IMPLANT
GLOVE PI ULTRA LF STRL 7.5 (GLOVE) ×2 IMPLANT
GLOVE SURG SYN 6.5 PF PI BL (GLOVE) ×2 IMPLANT
GLOVE SURG SYN 8.5 PF PI BL (GLOVE) ×2 IMPLANT
LENS IOL TECNIS EYHANCE 18.0 (Intraocular Lens) IMPLANT
NDL FILTER BLUNT 18X1 1/2 (NEEDLE) ×2 IMPLANT
NEEDLE FILTER BLUNT 18X1 1/2 (NEEDLE) ×1 IMPLANT
SYR 3ML LL SCALE MARK (SYRINGE) ×2 IMPLANT

## 2024-04-04 NOTE — Op Note (Signed)
 OPERATIVE NOTE  Katrina Bailey [id number] 04/04/2024   PREOPERATIVE DIAGNOSIS:  Nuclear sclerotic cataract left eye.  H25.12   POSTOPERATIVE DIAGNOSIS:    Nuclear sclerotic cataract left eye.     PROCEDURE:  Phacoemusification with posterior chamber intraocular lens placement of the left eye   LENS:   Implant Name Type Inv. Item Serial No. Manufacturer Lot No. LRB No. Used Action  LENS IOL TECNIS EYHANCE 18.0 - [id number] Intraocular Lens LENS IOL TECNIS EYHANCE 18.0 [id number] [employer]  Left 1 Implanted      Procedure(s): PHACOEMULSIFICATION, CATARACT, WITH IOL INSERTION 11.25 01:29.1 (Left)  SURGEON:  Adine Novak, MD, MPH   ANESTHESIA:  Topical with tetracaine  drops augmented with 1% preservative-free intracameral lidocaine.  ESTIMATED BLOOD LOSS: <1 mL   COMPLICATIONS:  None.   DESCRIPTION OF PROCEDURE:  The patient was identified in the holding room and transported to the operating room and placed in the supine position under the operating microscope.  The left eye was identified as the operative eye and it was prepped and draped in the usual sterile ophthalmic fashion.   A 1.0 millimeter clear-corneal paracentesis was made at the 5:00 position. 0.5 ml of preservative-free 1% lidocaine with epinephrine was injected into the anterior chamber.  The anterior chamber was filled with viscoelastic.  A 2.4 millimeter keratome was used to make a near-clear corneal incision at the 2:00 position.  A curvilinear capsulorrhexis was made with a cystotome and capsulorrhexis forceps.  Balanced salt solution was used to hydrodissect and hydrodelineate the nucleus.   Phacoemulsification was then used in stop and chop fashion to remove the lens nucleus and epinucleus.  The remaining cortex was then removed using the irrigation and aspiration handpiece. Viscoelastic was then placed into the capsular bag to distend it for lens placement.  A lens was then injected into the capsular bag.  The  remaining viscoelastic was aspirated.   Wounds were hydrated with balanced salt solution.  The anterior chamber was inflated to a physiologic pressure with balanced salt solution.  Intracameral vigamox 0.1 mL undiltued was injected into the eye and a drop placed onto the ocular surface.  No wound leaks were noted.  The patient was taken to the recovery room in stable condition without complications of anesthesia or surgery  Adine Novak 04/04/2024, 8:46 AM

## 2024-04-04 NOTE — Anesthesia Postprocedure Evaluation (Signed)
 Anesthesia Post Note  Patient: Katrina Bailey  Procedure(s) Performed: PHACOEMULSIFICATION, CATARACT, WITH IOL INSERTION 11.25 01:29.1 (Left: Eye)  Patient location during evaluation: PACU Anesthesia Type: MAC Level of consciousness: awake and alert Pain management: pain level controlled Vital Signs Assessment: post-procedure vital signs reviewed and stable Respiratory status: spontaneous breathing, nonlabored ventilation, respiratory function stable and patient connected to nasal cannula oxygen Cardiovascular status: stable and blood pressure returned to baseline Postop Assessment: no apparent nausea or vomiting Anesthetic complications: no   No notable events documented.   Last Vitals:  Vitals:   04/04/24 0847 04/04/24 0852  BP: 127/62 134/64  Pulse: 69   Resp: 20   Temp: (!) 36.2 C (!) 36.2 C  SpO2: 98%     Last Pain:  Vitals:   04/04/24 0847  TempSrc:   PainSc: 0-No pain                 Tennyson Kallen C Solita Macadam

## 2024-04-04 NOTE — H&P (Signed)
 [facility]   Primary Care Physician:  Britta Aricka Goldberger, MD Ophthalmologist: Dr. Adine Novak  Pre-Procedure History & Physical: HPI:  Katrina Bailey is a 89 y.o. female here for cataract surgery.   Past Medical History:  Diagnosis Date   Arthritis    Depression    in the past   H/O heart artery stent 2002   Hard of hearing    HTN (hypertension)     History reviewed. No pertinent surgical history.  Prior to Admission medications   Medication Sig Start Date End Date Taking? Authorizing Provider  metoprolol  tartrate (LOPRESSOR ) 100 MG tablet TAKE 1 TABLET (100 MG TOTAL) BY MOUTH DAILY. 11/18/21  Yes Masoud, Braxson Hollingsworth, MD  simvastatin  (ZOCOR ) 40 MG tablet TAKE 1 TABLET EVERY DAY 02/21/22  Yes Britta Lathyn Griggs, MD  amLODipine  (NORVASC ) 10 MG tablet Take 1 10 mg tablet Daily 07/25/21 10/25/21  Britta Deaire Mcwhirter, MD    Allergies as of 03/10/2024 - Review Complete 11/04/2021  Allergen Reaction Noted   Penicillins Rash 02/23/2024    History reviewed. No pertinent family history.  Social History   Socioeconomic History   Marital status: Widowed    Spouse name: Not on file   Number of children: Not on file   Years of education: Not on file   Highest education level: Not on file  Occupational History   Not on file  Tobacco Use   Smoking status: Former    Current packs/day: 0.00    Types: Cigarettes    Quit date: 06/1958    Years since quitting: 65.8   Smokeless tobacco: Never  Vaping Use   Vaping status: Never Used  Substance and Sexual Activity   Alcohol use: Not Currently    Comment: stopped 15-20 years ago   Drug use: Never   Sexual activity: Not Currently  Other Topics Concern   Not on file  Social History Narrative   Not on file   Social Drivers of Health   Financial Resource Strain: Not on file  Food Insecurity: Not on file  Transportation Needs: Not on file  Physical Activity: Not on file  Stress: Not on file  Social Connections: Not on file  Intimate Partner  Violence: Not on file    Review of Systems: See HPI, otherwise negative ROS  Physical Exam: BP 125/62   Pulse 72   Temp 97.7 F (36.5 C) (Temporal)   Resp 18   Ht 5' 2 (1.575 m)   Wt 56 kg   SpO2 97%   BMI 22.58 kg/m  General:   Alert, cooperative. Head:  Normocephalic and atraumatic. Respiratory:  Normal work of breathing. Cardiovascular:  NAD  Impression/Plan: Katrina Bailey is here for cataract surgery.  Risks, benefits, limitations, and alternatives regarding cataract surgery have been reviewed with the patient.  Questions have been answered.  All parties agreeable.   Adine Novak, MD  04/04/2024, 8:15 AM

## 2024-04-04 NOTE — Transfer of Care (Signed)
 Immediate Anesthesia Transfer of Care Note  Patient: Katrina Bailey  Procedure(s) Performed: PHACOEMULSIFICATION, CATARACT, WITH IOL INSERTION 11.25 01:29.1 (Left: Eye)  Patient Location: PACU  Anesthesia Type: MAC  Level of Consciousness: awake, alert  and patient cooperative  Airway and Oxygen Therapy: Patient Spontanous Breathing and Patient connected to supplemental oxygen  Post-op Assessment: Post-op Vital signs reviewed, Patient's Cardiovascular Status Stable, Respiratory Function Stable, Patent Airway and No signs of Nausea or vomiting  Post-op Vital Signs: Reviewed and stable  Complications: No notable events documented.

## 2024-04-13 DIAGNOSIS — H353221 Exudative age-related macular degeneration, left eye, with active choroidal neovascularization: Secondary | ICD-10-CM | POA: Diagnosis not present
